# Patient Record
Sex: Male | Born: 1955 | Race: White | Hispanic: No | Marital: Married | Smoking: Never smoker
Health system: Southern US, Community
[De-identification: ages and names within clinical notes are randomized; demographics above are authoritative.]

## PROBLEM LIST (undated history)

## (undated) DIAGNOSIS — N400 Enlarged prostate without lower urinary tract symptoms: Secondary | ICD-10-CM

## (undated) DIAGNOSIS — M5126 Other intervertebral disc displacement, lumbar region: Secondary | ICD-10-CM

## (undated) DIAGNOSIS — T85398A Other mechanical complication of other ocular prosthetic devices, implants and grafts, initial encounter: Secondary | ICD-10-CM

## (undated) DIAGNOSIS — F32A Depression, unspecified: Secondary | ICD-10-CM

## (undated) DIAGNOSIS — E785 Hyperlipidemia, unspecified: Secondary | ICD-10-CM

## (undated) DIAGNOSIS — F419 Anxiety disorder, unspecified: Secondary | ICD-10-CM

## (undated) DIAGNOSIS — Z8614 Personal history of Methicillin resistant Staphylococcus aureus infection: Secondary | ICD-10-CM

## (undated) DIAGNOSIS — M1990 Unspecified osteoarthritis, unspecified site: Secondary | ICD-10-CM

## (undated) DIAGNOSIS — Z872 Personal history of diseases of the skin and subcutaneous tissue: Secondary | ICD-10-CM

## (undated) HISTORY — DX: Depression, unspecified: F32.A

## (undated) HISTORY — DX: Other mechanical complication of other ocular prosthetic devices, implants and grafts, initial encounter: T85.398A

## (undated) HISTORY — DX: Other intervertebral disc displacement, lumbar region: M51.26

## (undated) HISTORY — DX: Benign prostatic hyperplasia without lower urinary tract symptoms: N40.0

## (undated) HISTORY — DX: Hyperlipidemia, unspecified: E78.5

## (undated) HISTORY — PX: SPINE SURGERY: SHX786

## (undated) HISTORY — DX: Personal history of Methicillin resistant Staphylococcus aureus infection: Z86.14

## (undated) HISTORY — DX: Personal history of diseases of the skin and subcutaneous tissue: Z87.2

## (undated) HISTORY — DX: Unspecified osteoarthritis, unspecified site: M19.90

## (undated) HISTORY — DX: Anxiety disorder, unspecified: F41.9

---

## 1996-10-05 DIAGNOSIS — Z872 Personal history of diseases of the skin and subcutaneous tissue: Secondary | ICD-10-CM

## 1996-10-05 HISTORY — DX: Personal history of diseases of the skin and subcutaneous tissue: Z87.2

## 1999-10-06 HISTORY — PX: EYE SURGERY: SHX253

## 2002-08-04 ENCOUNTER — Ambulatory Visit
Admission: RE | Admit: 2002-08-04 | Disposition: A | Discharge status: Home / Self Care | Payer: Self-pay | Source: Ambulatory Visit

## 2002-08-14 ENCOUNTER — Ambulatory Visit
Admission: RE | Admit: 2002-08-14 | Disposition: A | Discharge status: Home / Self Care | Payer: Self-pay | Source: Ambulatory Visit

## 2002-10-05 HISTORY — PX: EYE SURGERY: SHX253

## 2006-06-04 ENCOUNTER — Ambulatory Visit: Admit: 2006-06-04 | Disposition: A | Discharge status: Home / Self Care | Payer: Self-pay | Source: Ambulatory Visit

## 2010-02-16 ENCOUNTER — Emergency Department
Admission: EM | Admit: 2010-02-16 | Disposition: A | Discharge status: Home / Self Care | Payer: Self-pay | Source: Ambulatory Visit

## 2010-02-24 ENCOUNTER — Ambulatory Visit
Admission: RE | Admit: 2010-02-24 | Disposition: A | Discharge status: Home / Self Care | Payer: Self-pay | Source: Ambulatory Visit

## 2011-05-29 ENCOUNTER — Ambulatory Visit: Payer: Self-pay | Admitting: Internal Medicine

## 2011-05-29 ENCOUNTER — Ambulatory Visit (INDEPENDENT_AMBULATORY_CARE_PROVIDER_SITE_OTHER): Admitting: Internal Medicine

## 2011-05-29 ENCOUNTER — Telehealth: Payer: Self-pay | Admitting: Internal Medicine

## 2011-05-29 ENCOUNTER — Encounter: Payer: Self-pay | Admitting: Internal Medicine

## 2011-05-29 VITALS — BP 128/78 | HR 78 | Temp 98.3°F | Resp 16 | Ht 73.0 in | Wt 238.5 lb

## 2011-05-29 DIAGNOSIS — N201 Calculus of ureter: Secondary | ICD-10-CM

## 2011-05-29 DIAGNOSIS — Z1211 Encounter for screening for malignant neoplasm of colon: Secondary | ICD-10-CM

## 2011-05-29 DIAGNOSIS — N401 Enlarged prostate with lower urinary tract symptoms: Secondary | ICD-10-CM | POA: Insufficient documentation

## 2011-05-29 DIAGNOSIS — R5383 Other fatigue: Secondary | ICD-10-CM

## 2011-05-29 DIAGNOSIS — R5381 Other malaise: Secondary | ICD-10-CM

## 2011-05-29 DIAGNOSIS — E785 Hyperlipidemia, unspecified: Secondary | ICD-10-CM

## 2011-05-29 DIAGNOSIS — R339 Retention of urine, unspecified: Secondary | ICD-10-CM

## 2011-05-29 DIAGNOSIS — R972 Elevated prostate specific antigen [PSA]: Secondary | ICD-10-CM

## 2011-05-29 DIAGNOSIS — N400 Enlarged prostate without lower urinary tract symptoms: Secondary | ICD-10-CM

## 2011-05-29 DIAGNOSIS — R413 Other amnesia: Secondary | ICD-10-CM

## 2011-05-29 DIAGNOSIS — F419 Anxiety disorder, unspecified: Secondary | ICD-10-CM

## 2011-05-29 DIAGNOSIS — H269 Unspecified cataract: Secondary | ICD-10-CM

## 2011-05-29 DIAGNOSIS — F411 Generalized anxiety disorder: Secondary | ICD-10-CM

## 2011-05-29 DIAGNOSIS — Z125 Encounter for screening for malignant neoplasm of prostate: Secondary | ICD-10-CM

## 2011-05-29 DIAGNOSIS — R338 Other retention of urine: Secondary | ICD-10-CM

## 2011-05-29 DIAGNOSIS — R519 Headache, unspecified: Secondary | ICD-10-CM | POA: Insufficient documentation

## 2011-05-29 DIAGNOSIS — R51 Headache: Secondary | ICD-10-CM

## 2011-05-29 MED ORDER — ALFUZOSIN HCL ER 10 MG PO TB24: 10.0000 mg | ORAL_TABLET | Freq: Every day | ORAL | Status: AC

## 2011-05-29 MED ORDER — CITALOPRAM HYDROBROMIDE 20 MG PO TABS: ORAL_TABLET | ORAL | Status: DC

## 2011-05-29 NOTE — Telephone Encounter (Signed)
PSA added to future orders

## 2011-05-29 NOTE — Patient Instructions (Signed)
Begin with 1/2 tablet daily of citalopram  With dinner.  After one week increase to whole tablet daily. Call if your insomnia has ot imporved in two weeks.

## 2011-05-29 NOTE — Progress Notes (Signed)
  Subjective:    Patient ID: Jonathon Laura., male    DOB: 1955-11-09, 55 y.o.   MRN: [id number]  HPI New patientr presents with memory loss,  Short term.  History of heavy alcohol and marijuana  use for 5 years which ended in 1986, remembers having touble finishing homework, procrastination.  Went to 2 yrs of community college for an AA in liberal arts before joining the [employer].   Wife does not report manic behavior but does have trouble with anger management and labile temperament with occcasional physical outbursts.  Daugher died 5 yrs ago today from [place],  Son was previously diagnosed with ADHD.  Previous trial of wellbutrin 150 mg daily 5 yrs ago for a year, not compliant.  5 previous minor accidents.    Also reports frequent headaches brought on by heat and humidity, occipital, relieved partially with NSAIDs.  Not daily.   Review of Systems  Constitutional: Negative for fever, chills, fatigue and unexpected weight change.  HENT: Negative for ear pain and congestion.   Eyes: Negative for photophobia and visual disturbance.  Respiratory: Negative for chest tightness and shortness of breath.   Cardiovascular: Negative for chest pain, palpitations and leg swelling.  Genitourinary: Positive for difficulty urinating. Negative for urgency, frequency, flank pain, discharge and scrotal swelling.  Musculoskeletal: Negative for back pain and joint swelling.  Neurological: Positive for headaches. Negative for dizziness, tremors and syncope.  Hematological: Negative for adenopathy.  Psychiatric/Behavioral: Positive for decreased concentration. Negative for confusion.       Objective:   Physical Exam  Constitutional: He is oriented to person, place, and time. He appears well-developed and well-nourished.  HENT:  Head: Normocephalic.  Eyes: EOM are normal. Pupils are equal, round, and reactive to light.  Neck: Normal range of motion. No thyromegaly present.  Cardiovascular: Normal rate,  regular rhythm and normal heart sounds.   Pulmonary/Chest: Effort normal and breath sounds normal.  Musculoskeletal: Normal range of motion.  Neurological: He is alert and oriented to person, place, and time. He has normal reflexes.  Skin: Skin is warm and dry.  Psychiatric: He has a normal mood and affect. His behavior is normal. Judgment and thought content normal.          Assessment & Plan:  1) Mood disorder:  We discussed his attention deficit , insomnia, labile moods and there are no signs or symptoms of bipolar disorder.  He appears to have generalized anxiety disorder.  Prior trial of wellbutrin for ADD was not effective.  Trial of citalopram.  Return in one month.  2) BPH: by history with prior use of uroxatral with refill requested. 3) Screening for prostate CA:  PSA ordered 4) History of hyperlipidemia:  Lipids ordered.

## 2011-05-29 NOTE — Telephone Encounter (Signed)
Patientt would like psa added to his lab order. Please advise.

## 2011-05-30 DIAGNOSIS — N400 Enlarged prostate without lower urinary tract symptoms: Secondary | ICD-10-CM | POA: Insufficient documentation

## 2011-05-30 DIAGNOSIS — F419 Anxiety disorder, unspecified: Secondary | ICD-10-CM | POA: Insufficient documentation

## 2011-06-02 ENCOUNTER — Other Ambulatory Visit (INDEPENDENT_AMBULATORY_CARE_PROVIDER_SITE_OTHER): Admitting: *Deleted

## 2011-06-02 ENCOUNTER — Encounter: Payer: Self-pay | Admitting: Gastroenterology

## 2011-06-02 DIAGNOSIS — E785 Hyperlipidemia, unspecified: Secondary | ICD-10-CM

## 2011-06-02 DIAGNOSIS — F411 Generalized anxiety disorder: Secondary | ICD-10-CM

## 2011-06-02 DIAGNOSIS — N201 Calculus of ureter: Secondary | ICD-10-CM

## 2011-06-02 DIAGNOSIS — Z125 Encounter for screening for malignant neoplasm of prostate: Secondary | ICD-10-CM

## 2011-06-02 LAB — LIPID PANEL
HDL: 41.3 mg/dL (ref >39.00)
Total CHOL/HDL Ratio: 5
Triglycerides: 81 mg/dL (ref 0.0–149.0)

## 2011-06-02 LAB — COMPREHENSIVE METABOLIC PANEL
Alkaline Phosphatase: 64 U/L (ref 39–117)
Glucose, Bld: 75 mg/dL (ref 70–99)
Sodium: 140 mEq/L (ref 135–145)
Total Bilirubin: 1.4 mg/dL — ABNORMAL HIGH (ref 0.3–1.2)
Total Protein: 7.1 g/dL (ref 6.0–8.3)

## 2011-06-02 LAB — TSH: TSH: 1.1 u[IU]/mL (ref 0.35–5.50)

## 2011-06-03 ENCOUNTER — Encounter: Payer: Self-pay | Admitting: Internal Medicine

## 2011-06-12 ENCOUNTER — Encounter

## 2011-06-22 ENCOUNTER — Encounter

## 2011-06-22 ENCOUNTER — Telehealth: Payer: Self-pay | Admitting: *Deleted

## 2011-06-22 NOTE — Telephone Encounter (Signed)
Pt. No show for previsit.  Spoke with pt and he said 07/06/11 for his colon would need to be cleared with his wife's schedule.  Pt. Agreed to call and [facility] his colon and previsit when he knew his wife's schedule.  I gave him the [phone] #.

## 2011-06-23 ENCOUNTER — Encounter: Payer: Self-pay | Admitting: Gastroenterology

## 2011-06-25 ENCOUNTER — Other Ambulatory Visit: Admitting: Gastroenterology

## 2011-06-26 ENCOUNTER — Ambulatory Visit (INDEPENDENT_AMBULATORY_CARE_PROVIDER_SITE_OTHER): Admitting: Internal Medicine

## 2011-06-26 ENCOUNTER — Encounter: Payer: Self-pay | Admitting: Internal Medicine

## 2011-06-26 VITALS — BP 128/79 | HR 85 | Temp 98.4°F | Resp 16 | Ht 73.0 in | Wt 237.5 lb

## 2011-06-26 DIAGNOSIS — E785 Hyperlipidemia, unspecified: Secondary | ICD-10-CM

## 2011-06-26 DIAGNOSIS — F3289 Other specified depressive episodes: Secondary | ICD-10-CM

## 2011-06-26 DIAGNOSIS — M549 Dorsalgia, unspecified: Secondary | ICD-10-CM

## 2011-06-26 DIAGNOSIS — F411 Generalized anxiety disorder: Secondary | ICD-10-CM

## 2011-06-26 DIAGNOSIS — F329 Major depressive disorder, single episode, unspecified: Secondary | ICD-10-CM

## 2011-06-26 DIAGNOSIS — F419 Anxiety disorder, unspecified: Secondary | ICD-10-CM

## 2011-06-26 DIAGNOSIS — G8929 Other chronic pain: Secondary | ICD-10-CM

## 2011-06-26 MED ORDER — CITALOPRAM HYDROBROMIDE 20 MG PO TABS
20.0000 mg | ORAL_TABLET | Freq: Every day | ORAL | Status: DC
Start: 2011-06-26 — End: 2011-08-20

## 2011-06-26 MED ORDER — TAMSULOSIN HCL 0.4 MG PO CAPS: 0.4000 mg | ORAL_CAPSULE | Freq: Every day | ORAL | Status: DC

## 2011-06-26 MED ORDER — HYDROCODONE-ACETAMINOPHEN 7.5-500 MG PO TABS: 1.0000 | ORAL_TABLET | Freq: Every evening | ORAL | Status: AC | PRN

## 2011-06-28 ENCOUNTER — Encounter: Payer: Self-pay | Admitting: Internal Medicine

## 2011-06-28 DIAGNOSIS — M5416 Radiculopathy, lumbar region: Secondary | ICD-10-CM | POA: Insufficient documentation

## 2011-06-28 NOTE — Assessment & Plan Note (Addendum)
improved with citalopram.  No dose changes today.

## 2011-06-28 NOTE — Assessment & Plan Note (Signed)
With history of prior lumbar surgery.  He uses naproxed during the day but his evening dose is not effective ad he is losing sleep due to tossing and turning from pain.  Trial of vicodin for nocturnal pain .

## 2011-06-28 NOTE — Progress Notes (Signed)
  Subjective:    Patient ID: Jonathon Laura., male    DOB: 1956/03/17, 55 y.o.   MRN: [id number]  HPI  55 yo white male first seen 1 month ago for with cc of anxiety disorder and BPH returns for medical followup affter initiating therapy with celexa .  He started with 10 ng fdaily for one week and then increased to 20 mg dailyis wife states that he is less irritable and appears more relaxed.  Patient reports no adverse effects and is concentrating better but not sleeping well fdue to chronic back pain that is not alleviated by Alleve.   No past medical history on file.   Current Outpatient Prescriptions on File Prior to Visit  Medication Sig Dispense Refill  . acetaminophen (TYLENOL) 325 MG tablet Take 650 mg by mouth as needed.        Marland Kitchen alfuzosin (UROXATRAL) 10 MG 24 hr tablet Take 1 tablet (10 mg total) by mouth daily.  30 tablet  5    Review of Systems  Constitutional: Negative for fever, chills, diaphoresis, activity change, appetite change, fatigue and unexpected weight change.  HENT: Negative for hearing loss, ear pain, nosebleeds, congestion, sore throat, facial swelling, rhinorrhea, sneezing, drooling, mouth sores, trouble swallowing, neck pain, neck stiffness, dental problem, voice change, postnasal drip, sinus pressure, tinnitus and ear discharge.   Eyes: Negative for photophobia, pain, discharge, redness, itching and visual disturbance.  Respiratory: Negative for apnea, cough, choking, chest tightness, shortness of breath, wheezing and stridor.   Cardiovascular: Negative for chest pain, palpitations and leg swelling.  Gastrointestinal: Negative for nausea, vomiting, abdominal pain, diarrhea, constipation, blood in stool, abdominal distention, anal bleeding and rectal pain.  Genitourinary: Negative for dysuria, urgency, frequency, hematuria, flank pain, decreased urine volume, scrotal swelling, difficulty urinating and testicular pain.  Musculoskeletal: Negative for myalgias,  back pain, joint swelling, arthralgias and gait problem.  Skin: Negative for color change, rash and wound.  Neurological: Negative for dizziness, tremors, seizures, syncope, speech difficulty, weakness, light-headedness, numbness and headaches.  Psychiatric/Behavioral: Negative for suicidal ideas, hallucinations, behavioral problems, confusion, sleep disturbance, dysphoric mood, decreased concentration and agitation. The patient is not nervous/anxious.        Objective:   Physical Exam  Constitutional: He is oriented to person, place, and time.  HENT:  Head: Normocephalic and atraumatic.  Mouth/Throat: Oropharynx is clear and moist.  Eyes: Conjunctivae and EOM are normal.  Neck: Normal range of motion. Neck supple. No JVD present. No thyromegaly present.  Cardiovascular: Normal rate, regular rhythm and normal heart sounds.   Pulmonary/Chest: Effort normal and breath sounds normal. He has no wheezes. He has no rales.  Abdominal: Soft. Bowel sounds are normal. He exhibits no mass. There is no tenderness. There is no rebound.  Musculoskeletal: Normal range of motion. He exhibits no edema.  Neurological: He is alert and oriented to person, place, and time.  Skin: Skin is warm and dry.  Psychiatric: He has a normal mood and affect.          Assessment & Plan:

## 2011-06-28 NOTE — Assessment & Plan Note (Addendum)
Mild, with LDL of 152 and HDL of 41.  Discussed diet and exercise as first therapy ,  Repeat in 6 months.

## 2011-06-29 ENCOUNTER — Telehealth: Payer: Self-pay | Admitting: *Deleted

## 2011-06-29 ENCOUNTER — Encounter

## 2011-06-29 NOTE — Telephone Encounter (Signed)
Pt. Contacted re: no show for previsit.  Wanted to [facility] his colon to a Monday and then decided the 15th of Oct. Wouldn't work either.  Then the computer froze up and he decided he would call another day and [facility] his colon when he had a better idea of his schedule.  Jonathon Hill

## 2011-07-06 ENCOUNTER — Other Ambulatory Visit: Admitting: Gastroenterology

## 2011-07-17 ENCOUNTER — Other Ambulatory Visit: Admitting: Gastroenterology

## 2011-07-20 ENCOUNTER — Other Ambulatory Visit: Admitting: Gastroenterology

## 2011-07-24 ENCOUNTER — Telehealth: Payer: Self-pay

## 2011-07-24 DIAGNOSIS — N138 Other obstructive and reflux uropathy: Secondary | ICD-10-CM

## 2011-07-24 NOTE — Telephone Encounter (Signed)
Pt's wife checking status of urology referral. Wife states that she called last week and was told that it would be done. No order placed. Please advise

## 2011-07-24 NOTE — Telephone Encounter (Signed)
Left message to notify pt's wife to call back with name of urology preference

## 2011-07-24 NOTE — Telephone Encounter (Signed)
It has not been done yet.  Does he have a preference who I send him to ?

## 2011-07-24 NOTE — Telephone Encounter (Signed)
Wife called back. He would like referral to Dr Achilles Dunk in [place], They are aware there may be a long wait to get into this MD.

## 2011-07-29 NOTE — Telephone Encounter (Signed)
I called and got patient an appt for Dec. 19,2012 with Dr. Achilles Dunk, patient is aware of appt.

## 2011-08-20 ENCOUNTER — Other Ambulatory Visit: Payer: Self-pay | Admitting: Internal Medicine

## 2011-08-20 DIAGNOSIS — F329 Major depressive disorder, single episode, unspecified: Secondary | ICD-10-CM

## 2011-08-20 DIAGNOSIS — N400 Enlarged prostate without lower urinary tract symptoms: Secondary | ICD-10-CM

## 2011-08-23 MED ORDER — CITALOPRAM HYDROBROMIDE 20 MG PO TABS: 20.0000 mg | ORAL_TABLET | Freq: Every day | ORAL | Status: DC

## 2011-08-23 MED ORDER — TAMSULOSIN HCL 0.4 MG PO CAPS: 0.4000 mg | ORAL_CAPSULE | Freq: Every day | ORAL | Status: DC

## 2011-08-23 NOTE — Telephone Encounter (Signed)
meds refill electronically

## 2011-12-25 ENCOUNTER — Ambulatory Visit: Admitting: Internal Medicine

## 2011-12-28 ENCOUNTER — Ambulatory Visit (INDEPENDENT_AMBULATORY_CARE_PROVIDER_SITE_OTHER): Admitting: Internal Medicine

## 2011-12-28 ENCOUNTER — Encounter: Payer: Self-pay | Admitting: Internal Medicine

## 2011-12-28 VITALS — BP 112/62 | HR 88 | Temp 97.9°F | Resp 14 | Ht 74.0 in | Wt 230.2 lb

## 2011-12-28 DIAGNOSIS — R339 Retention of urine, unspecified: Secondary | ICD-10-CM

## 2011-12-28 DIAGNOSIS — G8929 Other chronic pain: Secondary | ICD-10-CM

## 2011-12-28 DIAGNOSIS — F411 Generalized anxiety disorder: Secondary | ICD-10-CM

## 2011-12-28 DIAGNOSIS — M549 Dorsalgia, unspecified: Secondary | ICD-10-CM

## 2011-12-28 DIAGNOSIS — N401 Enlarged prostate with lower urinary tract symptoms: Secondary | ICD-10-CM

## 2011-12-28 DIAGNOSIS — H269 Unspecified cataract: Secondary | ICD-10-CM | POA: Insufficient documentation

## 2011-12-28 DIAGNOSIS — E785 Hyperlipidemia, unspecified: Secondary | ICD-10-CM

## 2011-12-28 DIAGNOSIS — F419 Anxiety disorder, unspecified: Secondary | ICD-10-CM

## 2011-12-28 LAB — COMPLETE METABOLIC PANEL WITH GFR
Albumin: 4.4 g/dL (ref 3.5–5.2)
BUN: 12 mg/dL (ref 6–23)
CO2: 28 mEq/L (ref 19–32)
Calcium: 9.2 mg/dL (ref 8.4–10.5)
Chloride: 104 mEq/L (ref 96–112)
GFR, Est African American: >89 mL/min
GFR, Est Non African American: >89 mL/min
Glucose, Bld: 86 mg/dL (ref 70–99)
Potassium: 4.5 mEq/L (ref 3.5–5.3)

## 2011-12-28 LAB — LIPID PANEL
Cholesterol: 191 mg/dL (ref 0–200)
HDL: 44.4 mg/dL (ref >39.00)
LDL Cholesterol: 129 mg/dL — ABNORMAL HIGH (ref 0–99)
Total CHOL/HDL Ratio: 4
Triglycerides: 87 mg/dL (ref 0.0–149.0)

## 2011-12-28 MED ORDER — ALPRAZOLAM 0.5 MG PO TABS: 0.5000 mg | ORAL_TABLET | Freq: Every day | ORAL | Status: AC | PRN

## 2011-12-28 NOTE — Patient Instructions (Addendum)
  Here are some key components of weight loss for the active adult (this is what Dr. Darrick Huntsman does)   Low glycemic index diet, eating 6 smaller meals daily  Protein  Shakes (EAS Carb Control  Or Atkins ,  Available everywhere,   In  cases at BJs )  2.5 carbs  Protein bar by Atkins (snack size,  BJ's)  At 10 am  Lunch: sandwich on pita bread (Nicholos's makes a pita bread and a flat bread , available at [employer] and [employer]J's)   Mid day :  Another protein bar,  Or cheese stick, almonds, walnuts, pistachios, pecans, peanuts,  Macadamia nuts  Lunch:  "mean and green:"  Meat, salad, and green veggie : use ranch, vinagrette,  Blue cheese, etc  Evening : Breyer's low carb fudgiscle, ice cream (Carb Smart)

## 2011-12-28 NOTE — Assessment & Plan Note (Signed)
No significant changes, no progression .  Continue prn vicodin.

## 2011-12-28 NOTE — Assessment & Plan Note (Signed)
He has been having increasing difficulty and was started on flomax by Urology recently.

## 2011-12-28 NOTE — Progress Notes (Signed)
Patient ID: Jonathon Hill., male   DOB: Aug 25, 1956, 56 y.o.   MRN: [id number]    Patient Active Problem List  Diagnoses  . Hyperlipidemia  . Benign prostatic hypertrophy with urinary retention  . Headache  . Cataract of left eye  . Anxiety disorder  . Chronic back pain greater than 3 months duration  . Anxiety  . Cataract    Subjective:  CC:   Chief Complaint  Patient presents with  . Follow-up    HPI:   Jonathon Hillis a 56 y.o. male who presents for  follow up on chronic low back pain and generalized anxiety.    His back pain has not changed.  He reports occasional radicular sxs to his foot but not on a daily basis.  He is using prn vicodin , not on a daily basis. His last surgery was in 2004, a tunnel decompression, with subsequent atrophy of muscles in left lateral foot.     Regarding his anxiety, he continues to take celexa on a daily basis.  His anxiety has increased recently due to home financial stressors. His wife's work has been cut back to 3 days/week.  Since he has retired from [employer], he feels micromanaged by her.    Regard his obesity,  He continues to work on his weight and has lost 8 lbs since September , but continues to have difficulty avoiding [employer] cookies and other confections . He is exercising regularly.      Past Medical History  Diagnosis Date  . Hyperlipidemia   . Anxiety   . Cataract     Past Surgical History  Procedure Date  . Spine surgery 1994, 2004    2 prior lumbar surgeries ([facility])  . Eye surgery 2012    cataract, detached retina 2001,  buckle of sclera  [facility]         The following portions of the patient's history were reviewed and updated as appropriate: Allergies, current medications, and problem list.    Review of Systems:   12 Pt  review of systems was negative except those addressed in the HPI,     History   Social History  . Marital Status: Married    Spouse Name: N/A    Number of  Children: N/A  . Years of Education: N/A   Occupational History  . Not on file.   Social History Main Topics  . Smoking status: Never Smoker   . Smokeless tobacco: Never Used  . Alcohol Use: Yes     rare  . Drug Use: No  . Sexually Active: Not on file   Other Topics Concern  . Not on file   Social History Narrative  . No narrative on file    Objective:  BP 112/62  Pulse 88  Temp(Src) 97.9 F (36.6 C) (Oral)  Resp 14  Ht 6\' 2"  (1.88 m)  Wt 230 lb 4 oz (104.441 kg)  BMI 29.56 kg/m2  SpO2 98%  General appearance: alert, cooperative and appears stated age Ears: normal TM's and external ear canals both ears Throat: lips, mucosa, and tongue normal; teeth and gums normal Neck: no adenopathy, no carotid bruit, supple, symmetrical, trachea midline and thyroid not enlarged, symmetric, no tenderness/mass/nodules Back: symmetric, no curvature. ROM normal. No CVA tenderness. Lungs: clear to auscultation bilaterally Heart: regular rate and rhythm, S1, S2 normal, no murmur, click, rub or gallop Abdomen: soft, non-tender; bowel sounds normal; no masses,  no organomegaly Pulses: 2+  and symmetric Skin: Skin color, texture, turgor normal. No rashes or lesions Lymph nodes: Cervical, supraclavicular, and axillary nodes normal.  Assessment and Plan:  Benign prostatic hypertrophy with urinary retention He has been having increasing difficulty and was started on flomax by Urology recently.  Anxiety disorder Aggravated by recent stressors and marital discord .  Spent 15 minutes discussing his symptoms and how he is dealing with the stress.  He would consider speaking with a counsellor/therapist if his wife would consider it.   Trial of alprazolam   Chronic back pain greater than 3 months duration No significant changes, no progression .  Continue prn vicodin.  Hyperlipidemia Improved, LDL dropped from 152 to 129 and  HDL rose from 41 to 44 with diet and exercise.     Updated  Medication List Outpatient Encounter Prescriptions as of 12/28/2011  Medication Sig Dispense Refill  . acetaminophen (TYLENOL) 325 MG tablet Take 650 mg by mouth as needed.        Marland Kitchen alfuzosin (UROXATRAL) 10 MG 24 hr tablet Take 1 tablet (10 mg total) by mouth daily.  30 tablet  5  . citalopram (CELEXA) 20 MG tablet Take 1 tablet (20 mg total) by mouth daily.  90 tablet  3  . finasteride (PROSCAR) 5 MG tablet Take 5 mg by mouth daily.      . naproxen sodium (ANAPROX) 220 MG tablet Take 220 mg by mouth 2 (two) times daily with a meal.        . Tamsulosin HCl (FLOMAX) 0.4 MG CAPS Take 1 capsule (0.4 mg total) by mouth daily.  90 capsule  3  . ALPRAZolam (XANAX) 0.5 MG tablet Take 1 tablet (0.5 mg total) by mouth daily as needed for anxiety.  20 tablet  1     Orders Placed This Encounter  Procedures  . COMPLETE METABOLIC PANEL WITH GFR  . Lipid panel    Return in about 6 months (around 06/29/2012).

## 2011-12-28 NOTE — Assessment & Plan Note (Addendum)
Improved, LDL dropped from 152 to 129 and  HDL rose from 41 to 44 with diet and exercise.

## 2011-12-28 NOTE — Assessment & Plan Note (Addendum)
Aggravated by recent stressors and marital discord .  Spent 15 minutes discussing his symptoms and how he is dealing with the stress.  He would consider speaking with a counsellor/therapist if his wife would consider it.   Trial of alprazolam

## 2012-02-05 ENCOUNTER — Other Ambulatory Visit: Payer: Self-pay | Admitting: *Deleted

## 2012-02-05 MED ORDER — HYDROCODONE-ACETAMINOPHEN 7.5-500 MG PO TABS
1.0000 | ORAL_TABLET | Freq: Four times a day (QID) | ORAL | Status: DC | PRN
Start: 2012-02-05 — End: 2012-02-09

## 2012-02-09 ENCOUNTER — Other Ambulatory Visit: Payer: Self-pay | Admitting: Internal Medicine

## 2012-02-10 MED ORDER — HYDROCODONE-ACETAMINOPHEN 7.5-500 MG PO TABS: 1.0000 | ORAL_TABLET | Freq: Four times a day (QID) | ORAL | Status: DC | PRN

## 2012-02-10 NOTE — Telephone Encounter (Signed)
This was authorized  on may 3,  Please check with pharmacy before refilling bc it should have been done .

## 2012-02-10 NOTE — Telephone Encounter (Signed)
[facility] stated they did receive the phone authorization but the medication has been expired and patient never did pick up Rx.  Rx has been called in.

## 2012-06-20 ENCOUNTER — Ambulatory Visit (INDEPENDENT_AMBULATORY_CARE_PROVIDER_SITE_OTHER): Admitting: Internal Medicine

## 2012-06-20 ENCOUNTER — Ambulatory Visit: Payer: Self-pay | Admitting: Urology

## 2012-06-20 ENCOUNTER — Encounter: Payer: Self-pay | Admitting: Internal Medicine

## 2012-06-20 VITALS — BP 98/68 | HR 72 | Resp 16 | Wt 230.5 lb

## 2012-06-20 DIAGNOSIS — F411 Generalized anxiety disorder: Secondary | ICD-10-CM

## 2012-06-20 DIAGNOSIS — M25512 Pain in left shoulder: Secondary | ICD-10-CM

## 2012-06-20 DIAGNOSIS — M25511 Pain in right shoulder: Secondary | ICD-10-CM

## 2012-06-20 DIAGNOSIS — E785 Hyperlipidemia, unspecified: Secondary | ICD-10-CM

## 2012-06-20 DIAGNOSIS — F329 Major depressive disorder, single episode, unspecified: Secondary | ICD-10-CM

## 2012-06-20 DIAGNOSIS — F419 Anxiety disorder, unspecified: Secondary | ICD-10-CM

## 2012-06-20 DIAGNOSIS — M25519 Pain in unspecified shoulder: Secondary | ICD-10-CM

## 2012-06-20 DIAGNOSIS — N400 Enlarged prostate without lower urinary tract symptoms: Secondary | ICD-10-CM

## 2012-06-20 MED ORDER — CITALOPRAM HYDROBROMIDE 20 MG PO TABS: 20.0000 mg | ORAL_TABLET | Freq: Every day | ORAL | Status: DC

## 2012-06-20 MED ORDER — TAMSULOSIN HCL 0.4 MG PO CAPS: 0.4000 mg | ORAL_CAPSULE | Freq: Every day | ORAL | Status: DC

## 2012-06-20 MED ORDER — GABAPENTIN 300 MG PO CAPS: 300.0000 mg | ORAL_CAPSULE | Freq: Three times a day (TID) | ORAL | Status: DC

## 2012-06-20 NOTE — Progress Notes (Signed)
Patient ID: Jonathon Laura., male   DOB: [birth year], 56 y.o.   MRN: [id number]  Patient Active Problem List  Diagnosis  . Hyperlipidemia  . Benign prostatic hypertrophy with urinary retention  . Headache  . Cataract of left eye  . Anxiety disorder  . Chronic back pain greater than 3 months duration  . Anxiety  . Cataract  . Bilateral shoulder pain    Subjective:  CC:   Chief Complaint  Patient presents with  . Follow-up    6 month    HPI:   Jonathon Hillis a 56 y.o. male who presents 6 month follow up generalized anxiety and arthritis with neuropathy. He has been having bilateral shoulder pain which has improved, with addition of new mattress.  The pain is greater in the left shoulder.   he is no history of prior trauma, but  use of overuse with sports and lifting .   the patient has fever and progressively worse over the years. He has had no prior orthopedic evaluation and is requesting one at this time .  Regarding his anxiety and depression this is well-controlled with citalopram which is tolerating well.. 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  the foot pain doesn't impact his ability to exercise.   Past Medical History  Diagnosis Date  . Hyperlipidemia   . Anxiety   . Cataract     Past Surgical History  Procedure Date  . Spine surgery 1994, 2004    2 prior lumbar surgeries ([facility])  . Eye surgery 2012    cataract, detached retina 2001,  buckle of sclera  [facility]         The following portions of the patient's history were reviewed and updated as appropriate: Allergies, current medications, and problem list.    Review of Systems:   12 Pt  review of systems was negative except those addressed in the HPI,     History   Social History  . Marital Status: Married    Spouse Name: N/A    Number of Children: N/A  . Years  of Education: N/A   Occupational History  . Not on file.   Social History Main Topics  . Smoking status: Never Smoker   . Smokeless tobacco: Never Used  . Alcohol Use: Yes     rare  . Drug Use: No  . Sexually Active: Not on file   Other Topics Concern  . Not on file   Social History Narrative  . No narrative on file    Objective:  BP 98/68  Pulse 72  Resp 16  Wt 230 lb 8 oz (104.554 kg)  SpO2 97%  General appearance: alert, cooperative and appears stated age Ears: normal TM's and external ear canals both ears Throat: lips, mucosa, and tongue normal; teeth and gums normal Neck: no adenopathy, no carotid bruit, supple, symmetrical, trachea midline and thyroid not enlarged, symmetric, no tenderness/mass/nodules Back: symmetric, no curvature. ROM normal. No CVA tenderness. Lungs: clear to auscultation bilaterally Heart: regular rate and rhythm, S1, S2 normal, no murmur, click, rub or gallop Abdomen: soft, non-tender; bowel sounds normal; no masses,  no organomegaly Pulses: 2+ and symmetric Skin: Skin color, texture, turgor normal. No rashes or lesions MSK: left shoulder with painful internal and abduction, strength R>L.  Lymph nodes: Cervical, supraclavicular, and axillary nodes normal. DTRS":  1+ bilateral patellars  Assessment and Plan:  Bilateral shoulder pain Exam and history suggestive of rotator cuff syndrome bilaterally. Left greater than right due to prior sports activities. Referral to resume her orthopedist underway.  Anxiety disorder Well controlled on citalopram.  Hyperlipidemia Improved with reduction in LDL at 30 points attributed to exercise weight loss. He is due for repeat lipids.   Updated Medication List Outpatient Encounter Prescriptions as of 06/20/2012  Medication Sig Dispense Refill  . acetaminophen (TYLENOL) 325 MG tablet Take 650 mg by mouth as needed.        . citalopram (CELEXA) 20 MG tablet Take 1 tablet (20 mg total) by mouth daily.  90  tablet  3  . naproxen sodium (ANAPROX) 220 MG tablet Take 220 mg by mouth 2 (two) times daily with a meal.        . Tamsulosin HCl (FLOMAX) 0.4 MG CAPS Take 1 capsule (0.4 mg total) by mouth daily.  90 capsule  3  . DISCONTD: citalopram (CELEXA) 20 MG tablet Take 1 tablet (20 mg total) by mouth daily.  90 tablet  3  . DISCONTD: Tamsulosin HCl (FLOMAX) 0.4 MG CAPS Take 1 capsule (0.4 mg total) by mouth daily.  90 capsule  3  . gabapentin (NEURONTIN) 300 MG capsule Take 1 capsule (300 mg total) by mouth 3 (three) times daily. As needed for neuropathic pain  90 capsule  3  . DISCONTD: finasteride (PROSCAR) 5 MG tablet Take 5 mg by mouth daily.         Orders Placed This Encounter  Procedures  . Lipid panel  . Comprehensive metabolic panel  . Ambulatory referral to Sports Medicine    No Follow-up on file.

## 2012-06-20 NOTE — Assessment & Plan Note (Signed)
Exam and history suggestive of rotator cuff syndrome bilaterally. Left greater than right due to prior sports activities. Referral to resume her orthopedist underway.

## 2012-06-20 NOTE — Assessment & Plan Note (Signed)
Well controlled on citalopram.  

## 2012-06-20 NOTE — Assessment & Plan Note (Signed)
Improved with reduction in LDL at 30 points attributed to exercise weight loss. He is due for repeat lipids.   

## 2012-06-20 NOTE — Patient Instructions (Addendum)
Return for fasting lipids at your convenience   You should use the naproxen daily for two weeks ,along with OTC Prilosec or Prevacid  To protect your stomach,  To treat back flares.  The gabapentin is to treat the pain from a pinched nerve in your back that is causing your calf and foot pain .  Alternate ice and heat,  15 minutes per session.  You are overdue for your colonoscopy; We will be happy to help you schedule it with [facility] if you have any trouble    This is  Dr. Norton Blizzard version of a  "Low GI"  Diet:  All of the foods can be found at grocery stores and in bulk at [employer].  The Atkins protein bars and shakes are available in more varieties at Target, WalMart and [employer].     7 AM Breakfast:  Low carbohydrate Protein  Shakes (I recommend the EAS AdvantEdge "Carb Control" shakes  Or the low carb shakes by Atkins.   Both are available everywhere:  In  cases at BJs  Or in 4 packs at grocery stores and pharmacies  2.5 carbs  (Alternative is  a toasted Arnold's Sandwhich Thin w/ peanut butter, a "Bagel Thin" with cream cheese and salmon) or  a scrambled egg burrito made with a low carb tortilla .  Avoid cereal and bananas, oatmeal too unless you are cooking the old fashioned kind that takes 30-40 minutes to prepare.  the rest is overly processed, has minimal fiber, and is loaded with carbohydrates!   10 AM: Protein bar by Atkins (the snack size, under 200 cal).  There are many varieties , available widely again or in bulk in limited varieties at BJs)  Other so called "protein bars" tend to be loaded with carbohydrates.  Remember, in food advertising, the word "energy" is synonymous for " carbohydrate."  Lunch: sandwich of [place], (or any lunchmeat, grilled meat or canned tuna), fresh avocado, mayonnaise  and cheese on a lower carbohydrate pita bread, flatbread, or tortilla . Ok to use regular mayonnaise. The bread is the only source or carbohydrate that can be decreased (Jerryl's makes  a pita bread and a flat bread that are 50 cal and 4 net carbs ; Toufayan makes a low carb flatbread that's 100 cal and 9 net carbs  and  [place] makes a low carb whole wheat tortilla  That is 210 cal and 6 net carbs)  3 PM:  Mid day :  Another protein bar,  Or a  cheese stick (100 cal, 0 carbs),  Or 1 ounce of  almonds, walnuts, pistachios, pecans, peanuts,  Macadamia nuts. Or a Dannon light n Fit greek yogurt, 80 cal 8 net carbs . Avoid "granola"; the dried cranberries and raisins are loaded with carbohydrates. Mixed nuts ok if no raisins or cranberries or dried fruit.      6 PM  Dinner:  "mean and green:"  Meat/chicken/fish or a high protein legume; , with a green salad, and a low GI  Veggie (broccoli, cauliflower, green beans, spinach, brussel sprouts. Lima beans) : Avoid "Low fat dressings, as well as Reyne Dumas and [address]! They are loaded with sugar! Instead use ranch, vinagrette,  Blue cheese, etc  9 PM snack : Breyer's "low carb" fudgsicle or  ice cream bar (Carb Smart line), or  Weight Watcher's ice cream bar , or another "no sugar added" ice cream;a serving of fresh berries/cherries with whipped cream (Avoid bananas, pineapple, grapes  and  watermelon on a regular basis because they are high in sugar)   Remember that snack Substitutions should be less than 15 to 20 carbs  Per serving. Remember to subtract fiber grams and sugar alcohols to get the "net carbs."

## 2012-06-27 ENCOUNTER — Institutional Professional Consult (permissible substitution): Admitting: Sports Medicine

## 2012-07-04 ENCOUNTER — Ambulatory Visit: Admitting: Internal Medicine

## 2012-07-06 ENCOUNTER — Encounter: Payer: Self-pay | Admitting: Gastroenterology

## 2012-07-11 ENCOUNTER — Encounter: Payer: Self-pay | Admitting: *Deleted

## 2012-07-11 ENCOUNTER — Ambulatory Visit (INDEPENDENT_AMBULATORY_CARE_PROVIDER_SITE_OTHER)

## 2012-07-11 ENCOUNTER — Encounter: Payer: Self-pay | Admitting: Sports Medicine

## 2012-07-11 ENCOUNTER — Ambulatory Visit (INDEPENDENT_AMBULATORY_CARE_PROVIDER_SITE_OTHER): Admitting: Sports Medicine

## 2012-07-11 VITALS — BP 123/67 | HR 75 | Ht 73.0 in | Wt 235.0 lb

## 2012-07-11 DIAGNOSIS — M25519 Pain in unspecified shoulder: Secondary | ICD-10-CM

## 2012-07-11 DIAGNOSIS — M25511 Pain in right shoulder: Secondary | ICD-10-CM

## 2012-07-11 MED ORDER — MELOXICAM 15 MG PO TABS: ORAL_TABLET | ORAL | Status: DC

## 2012-07-11 NOTE — Assessment & Plan Note (Addendum)
Left worse than right today. Ultrasound guided injection as above. X-ray shoulder. He will do one visit the physical therapist, and then daily home rehabilitation. Mobic. I will see him back in 4 weeks to reassess.

## 2012-07-11 NOTE — Progress Notes (Signed)
Subjective:    I'm seeing this patient as a consultation for:  Dr. Darrick Huntsman  CC: Left shoulder pain  HPI: Jonathon Hill is a very pleasant 15 male with bilateral shoulder pain, left worse than right. He's noticed this for years now. The pain is localized over the deltoid, is worse with overhead activities, wakes him up from sleep, and does not radiate past the elbow. He's taken occasional over-the-counter NSAID, has never had any formal evaluation for this pain. He denies any trauma. He does play golf twice a week, and tends to lift heavy objects at work.  Past medical history, Surgical history, Family history, Social history, Allergies, and medications have been entered into the medical record, reviewed, and no changes needed.   Review of Systems: No headache, visual changes, nausea, vomiting, diarrhea, constipation, dizziness, abdominal pain, skin rash, fevers, chills, night sweats, weight loss, swollen lymph nodes, body aches, joint swelling, muscle aches, chest pain, or shortness of breath.   Objective:   Vitals:  Afebrile, vital signs stable. General: Well Developed, well nourished, and in no acute distress.  Neuro/Psych: Alert and oriented x3, extra-ocular muscles intact, able to move all 4 extremities.  Skin: Warm and dry, no rashes noted.  Respiratory: Not using accessory muscles, speaking in full sentences, trachea midline.  Cardiovascular: Pulses palpable, no extremity edema. Abdomen: Does not appear distended. Left Shoulder: Inspection reveals no abnormalities, atrophy or asymmetry. There is minimal to palpation anteriorly. ROM is full in all planes. Rotator cuff strength normal throughout. Positive Neer's, Hawkins, and Empty Can tests. Strength is excellent. Speeds and Yergason's tests normal. No labral pathology noted with negative Obrien's, negative clunk and good stability. Normal scapular function observed. No painful arc and no drop arm sign. No apprehension sign  Procedure:  Real-time Ultrasound Guided Injection of left subacromial bursa Device: GE Logiq E  Ultrasound guided injection is preferred based studies that show increased duration, increased effect, greater accuracy, decreased procedural pain, increased response rate, and decreased cost with ultrasound guided versus blind injection.  Verbal informed consent obtained.  Time-out conducted.  Noted no overlying erythema, induration, or other signs of local infection.  Skin prepped in a sterile fashion.  Local anesthesia: Topical Ethyl chloride.  With sterile technique and under real time ultrasound guidance:  Subscapularis, supraspinatus, infraspinatus, teres minor, biceps tendon were all imaged, and there is no sign of tearing. He did have a distended subacromial bursa. Needle was advanced into this bursa, 1 cc Kenalog 40, 4 cc lidocaine injected easily, bursa seen distending under ultrasound guidance. Completed without difficulty  Pain immediately resolved suggesting accurate placement of the medication.  Advised to call if fevers/chills, erythema, induration, drainage, or persistent bleeding.  Images permanently stored and available for review in the ultrasound unit.  Impression: Technically successful ultrasound guided injection.   Impression and Recommendations:   This case required medical decision making of moderate complexity.

## 2012-07-25 ENCOUNTER — Ambulatory Visit: Attending: Sports Medicine

## 2012-07-25 DIAGNOSIS — IMO0001 Reserved for inherently not codable concepts without codable children: Secondary | ICD-10-CM | POA: Insufficient documentation

## 2012-07-25 DIAGNOSIS — M25519 Pain in unspecified shoulder: Secondary | ICD-10-CM | POA: Insufficient documentation

## 2012-08-08 ENCOUNTER — Encounter: Payer: Self-pay | Admitting: Sports Medicine

## 2012-08-08 ENCOUNTER — Ambulatory Visit (INDEPENDENT_AMBULATORY_CARE_PROVIDER_SITE_OTHER): Admitting: Sports Medicine

## 2012-08-08 ENCOUNTER — Ambulatory Visit (INDEPENDENT_AMBULATORY_CARE_PROVIDER_SITE_OTHER)

## 2012-08-08 VITALS — BP 128/79 | HR 82 | Wt 234.0 lb

## 2012-08-08 DIAGNOSIS — M25519 Pain in unspecified shoulder: Secondary | ICD-10-CM

## 2012-08-08 DIAGNOSIS — M545 Low back pain: Secondary | ICD-10-CM

## 2012-08-08 DIAGNOSIS — G8929 Other chronic pain: Secondary | ICD-10-CM

## 2012-08-08 DIAGNOSIS — IMO0002 Reserved for concepts with insufficient information to code with codable children: Secondary | ICD-10-CM

## 2012-08-08 DIAGNOSIS — M549 Dorsalgia, unspecified: Secondary | ICD-10-CM

## 2012-08-08 DIAGNOSIS — M25511 Pain in right shoulder: Secondary | ICD-10-CM

## 2012-08-08 MED ORDER — GABAPENTIN 300 MG PO CAPS: ORAL_CAPSULE | ORAL | Status: DC

## 2012-08-08 NOTE — Assessment & Plan Note (Signed)
Only on 300 mg gabapentin each bedtime. I will refill this, with an up taper. Rehabilitation exercises. X-rays He will come back to see me in 4 weeks, and if no better I would certainly like to repeat the MRI for interventional injection plan.

## 2012-08-08 NOTE — Progress Notes (Signed)
SPORTS MEDICINE CONSULTATION REPORT   Subjective:    CC: Followup shoulder pain  HPI: Bilateral shoulder pain: Left was far worse than the right with signs and symptoms suggestive of rotator cuff. He has been intermittent with his rotator cuff exercises, I did perform an ultrasound-guided subacromial bursa injection at the last visit. Overall he 60-70% better, is no longer woken up from sleep, is trying to avoid overhead activities as much as he can. 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    Degenerative disc disease: It sounds as though it had an L4-L5, and L5-S1 microdiscectomy for radicular symptoms in the past. Unfortunately, the entire workup, and treatment was delayed at the [place]. He is in it up with persistent weakness, with radicular symptoms down the left leg, the anterior left lower leg, with weakness to eversion and plantar flexion.  He is currently on gabapentin 300 mg at bedtime, but hasn't noticed any improvement in his symptoms. He does get cramping in his left leg and foot through the night. He will not wear an ankle foot orthosis at night.  Past medical history, Surgical history, Family history, Social history, Allergies, and medications have been entered into the medical record, reviewed, and no changes needed.   Review of Systems: No headache, visual changes, nausea, vomiting, diarrhea, constipation, dizziness, abdominal pain, skin rash, fevers, chills, night sweats, weight loss, swollen lymph nodes, body aches, joint swelling, muscle aches, chest pain, or shortness of breath.   Objective:   Vitals:  Afebrile, vital signs stable. General: Well Developed, well nourished, and in no acute distress.  Neuro/Psych: Alert and oriented x3, extra-ocular muscles intact, able to move all 4 extremities.  Skin: Warm and dry, no rashes noted.  Respiratory: Not using accessory muscles, speaking in full sentences, trachea midline.  Cardiovascular:  Pulses palpable, no extremity edema. Abdomen: Does not appear distended. Back Exam:  Inspection: Unremarkable  Motion: Flexion 45 deg, Extension 45 deg, Side Bending to 45 deg bilaterally, Weak to dorsiflexion and eversion, 4-/5. Rotation to 45 deg bilaterally. SLR laying: Negative  XSLR laying: Negative  Palpable tenderness: None. FABER: negative. Sensory change: Gross sensation intact to all lumbar and sacral dermatomes.  Reflexes: 2+ at both patellar tendons, 2+ at achilles tendons, Babinski's downgoing.  Strength at foot  Plantar-flexion: 5/5 Dorsi-flexion: 5/5 Eversion: 5/5 Inversion: 5/5  Leg strength  Quad: 5/5 Hamstring: 5/5 Hip flexor: 5/5 Hip abductors: 5/5  Gait unremarkable  Left Shoulder: Inspection reveals no abnormalities, atrophy or asymmetry. Palpation is normal with no tenderness over AC joint or bicipital groove. ROM is full in all planes. Rotator cuff strength normal throughout. No signs of impingement with negative Neer and Hawkin's tests, empty can sign. Speeds and Yergason's tests normal. No labral pathology noted with negative Obrien's, negative clunk and good stability. Normal scapular function observed. No painful arc and no drop arm sign. No apprehension sign.  Impression and Recommendations:   This case required medical decision making of moderate complexity.

## 2012-08-08 NOTE — Assessment & Plan Note (Signed)
Symptoms are 70% improved status post ultrasound-guided left subacromial injection. He's also been intermittently doing his rehabilitation. We agreed that he would be more aggressive with his home rehabilitation, daily. He can come back to see me on an as-needed basis for this, I can offer him a single additional ultrasound-guided injection before MRI, for surgical planning.

## 2012-08-08 NOTE — Patient Instructions (Addendum)
Spine surgeon would be Dr. Estill Bamberg at [facility], if surgery becomes an option.

## 2012-08-22 ENCOUNTER — Ambulatory Visit (INDEPENDENT_AMBULATORY_CARE_PROVIDER_SITE_OTHER): Admitting: Sports Medicine

## 2012-08-22 ENCOUNTER — Telehealth: Payer: Self-pay | Admitting: *Deleted

## 2012-08-22 ENCOUNTER — Ambulatory Visit (AMBULATORY_SURGERY_CENTER)

## 2012-08-22 VITALS — Ht 74.0 in | Wt 227.0 lb

## 2012-08-22 DIAGNOSIS — G8929 Other chronic pain: Secondary | ICD-10-CM

## 2012-08-22 DIAGNOSIS — Z1211 Encounter for screening for malignant neoplasm of colon: Secondary | ICD-10-CM

## 2012-08-22 DIAGNOSIS — M549 Dorsalgia, unspecified: Secondary | ICD-10-CM

## 2012-08-22 MED ORDER — PREDNISONE 50 MG PO TABS: ORAL_TABLET | ORAL | Status: DC

## 2012-08-22 MED ORDER — NA SULFATE-K SULFATE-MG SULF 17.5-3.13-1.6 GM/177ML PO SOLN: 1.0000 | Freq: Once | ORAL | Status: DC

## 2012-08-22 NOTE — Telephone Encounter (Signed)
Called [facility] and cancelled Prednisone RX that was sent. Dannielle Huh states they have put a hold on the account and that pt has to call to cancel the hold in 24-48 hrs. Wife informed.

## 2012-08-22 NOTE — Telephone Encounter (Signed)
Message copied by Renea Ee on Mon Aug 22, 2012 12:34 PM ------      Message from: Monica Becton      Created: Mon Aug 22, 2012 12:24 PM       [facility] Misty, I accidentally called in prednisone to [facility]. I have sent it in to his local pharmacy, please call [facility] to cancel his prednisone.      -T

## 2012-08-22 NOTE — Progress Notes (Signed)
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 He was currently doing the gabapentin up taper, but unfortunately has noted increased weakness, and pain that runs down the posterior aspect of his right calf, and the plantar aspect of his right foot. He has weakness with plantar flexion, and a little bit with dorsiflexion.  He denies any bowel or bladder incontinence, or numbness in the saddle region. Symptoms are moderate.  Past medical history, Surgical history, Family history, Social history, Allergies, and medications have been entered into the medical record, reviewed, and no changes needed.   Review of Systems: No headache, visual changes, nausea, vomiting, diarrhea, constipation, dizziness, abdominal pain, skin rash, fevers, chills, night sweats, weight loss, swollen lymph nodes, body aches, joint swelling, muscle aches, chest pain, or shortness of breath.   Objective:   Vitals:  Afebrile, vital signs stable. General: Well Developed, well nourished, and in no acute distress.  Neuro/Psych: Alert and oriented x3, extra-ocular muscles intact, able to move all 4 extremities.  Skin: Warm and dry, no rashes noted.  Respiratory: Not using accessory muscles, speaking in full sentences, trachea midline.  Cardiovascular: Pulses palpable, no extremity edema. Abdomen: Does not appear distended. Back Exam:  Inspection: Unremarkable  Motion: Flexion 45 deg, Extension 45 deg, Side Bending to 45 deg bilaterally, Weak to dorsiflexion and eversion, 4-/5. Rotation to 45 deg bilaterally. SLR laying: Negative  XSLR laying: Negative  Palpable tenderness: None. FABER: negative. Sensory change: Gross sensation intact to all lumbar and sacral dermatomes.  Reflexes: 2+ at both patellar tendons, 2+  at achilles tendons, Babinski's downgoing.  Strength at foot  Plantar-flexion: 5/5 Dorsi-flexion: 5/5 Eversion: 5/5 Inversion: 5/5  Leg strength  Quad: 5/5 Hamstring: 5/5 Hip flexor: 5/5 Hip abductors: 5/5  Gait unremarkable. He does have some difficulty with heel walking with difficulty dorsiflexing his right foot.  Impression and Recommendations:   This case required medical decision making of moderate complexity.

## 2012-08-22 NOTE — Assessment & Plan Note (Signed)
Jonathon Hill is unfortunately developing some new onset weakness in his right calf muscle along with some numbness and tingling in an S1 distribution. 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 Again, he is status post L4-L5, and L5-S1 microdiscectomy. We will burst of prednisone, and obtain an MRI of his lumbar spine. He will come back to see me to go over results of this and come up with a plan. I would also like him to continue his gabapentin taper.

## 2012-08-27 ENCOUNTER — Ambulatory Visit (HOSPITAL_BASED_OUTPATIENT_CLINIC_OR_DEPARTMENT_OTHER)
Admission: RE | Admit: 2012-08-27 | Discharge: 2012-08-27 | Disposition: A | Discharge status: Home / Self Care | Source: Ambulatory Visit | Attending: Sports Medicine | Admitting: Sports Medicine

## 2012-08-27 DIAGNOSIS — M79609 Pain in unspecified limb: Secondary | ICD-10-CM | POA: Insufficient documentation

## 2012-08-27 DIAGNOSIS — G8929 Other chronic pain: Secondary | ICD-10-CM | POA: Insufficient documentation

## 2012-08-27 DIAGNOSIS — M545 Low back pain, unspecified: Secondary | ICD-10-CM | POA: Insufficient documentation

## 2012-08-27 DIAGNOSIS — IMO0001 Reserved for inherently not codable concepts without codable children: Secondary | ICD-10-CM | POA: Insufficient documentation

## 2012-08-27 DIAGNOSIS — M549 Dorsalgia, unspecified: Secondary | ICD-10-CM

## 2012-08-27 MED ORDER — GADOBENATE DIMEGLUMINE 529 MG/ML IV SOLN
20.0000 mL | Freq: Once | INTRAVENOUS | Status: AC | PRN
  Administered 2012-08-27: 20 mL via INTRAVENOUS

## 2012-09-05 ENCOUNTER — Ambulatory Visit (INDEPENDENT_AMBULATORY_CARE_PROVIDER_SITE_OTHER): Admitting: Sports Medicine

## 2012-09-05 ENCOUNTER — Ambulatory Visit (AMBULATORY_SURGERY_CENTER): Admitting: Gastroenterology

## 2012-09-05 ENCOUNTER — Encounter: Payer: Self-pay | Admitting: Gastroenterology

## 2012-09-05 ENCOUNTER — Encounter: Payer: Self-pay | Admitting: Sports Medicine

## 2012-09-05 VITALS — BP 119/83 | HR 69 | Temp 98.7°F | Resp 30 | Ht 74.0 in | Wt 227.0 lb

## 2012-09-05 VITALS — BP 127/87 | HR 84 | Wt 229.0 lb

## 2012-09-05 DIAGNOSIS — Z1211 Encounter for screening for malignant neoplasm of colon: Secondary | ICD-10-CM

## 2012-09-05 DIAGNOSIS — M549 Dorsalgia, unspecified: Secondary | ICD-10-CM

## 2012-09-05 DIAGNOSIS — D126 Benign neoplasm of colon, unspecified: Secondary | ICD-10-CM

## 2012-09-05 DIAGNOSIS — K5730 Diverticulosis of large intestine without perforation or abscess without bleeding: Secondary | ICD-10-CM

## 2012-09-05 DIAGNOSIS — G8929 Other chronic pain: Secondary | ICD-10-CM

## 2012-09-05 MED ORDER — SODIUM CHLORIDE 0.9 % IV SOLN: 500.0000 mL | INTRAVENOUS | Status: DC

## 2012-09-05 NOTE — Op Note (Signed)
[facility] [address] [place], [address]   COLONOSCOPY PROCEDURE REPORT  PATIENT: Jonathon Hill, Jonathon Hill  MR#: [id number] BIRTHDATE: May 06, 1956 , 56  yrs. old GENDER: Male ENDOSCOPIST: Louis Meckel, MD REFERRED [id number] Darrick Huntsman, M.D. PROCEDURE DATE:  09/05/2012 PROCEDURE:   Colonoscopy with snare polypectomy ASA CLASS:   Class I INDICATIONS:average risk screening. MEDICATIONS: MAC sedation, administered by CRNA and propofol (Diprivan) 200mg  IV  DESCRIPTION OF PROCEDURE:   After the risks benefits and alternatives of the procedure were thoroughly explained, informed consent was obtained.  A digital rectal exam revealed no abnormalities of the rectum.   The LB CF-[id number]  endoscope was introduced through the anus and advanced to the cecum, which was identified by both the appendix and ileocecal valve. No adverse events experienced.   The quality of the prep was Suprep excellent The instrument was then slowly withdrawn as the colon was fully examined.      COLON FINDINGS: A sessile polyp measuring 3 mm in size was found in the descending colon.  A polypectomy was performed with a cold snare.  The resection was complete and the polyp tissue was completely retrieved.   A sessile polyp measuring 2 mm in size was found in the rectum.  A polypectomy was performed with a cold snare.  The resection was complete and the polyp tissue was completely retrieved.   Mild diverticulosis was noted in the sigmoid colon.   The colon mucosa was otherwise normal. Retroflexed views revealed no abnormalities. The time to cecum=3 minutes 08 seconds.  Withdrawal time=8 minutes 48 seconds.  The scope was withdrawn and the procedure completed. COMPLICATIONS: There were no complications.  ENDOSCOPIC IMPRESSION: 1.   Sessile polyp measuring 3 mm in size was found in the descending colon; polypectomy was performed with a cold snare 2.   Sessile polyp measuring 2 mm in  size was found in the rectum; polypectomy was performed with a cold snare 3.   Mild diverticulosis was noted in the sigmoid colon 4.   The colon mucosa was otherwise normal  RECOMMENDATIONS: If the polyp(s) removed today are proven to be adenomatous (pre-cancerous) polyps, you will need a repeat colonoscopy in 5 years.  Otherwise you should continue to follow colorectal cancer screening guidelines for "routine risk" patients with colonoscopy in 10 years.  You will receive a letter within 1-2 weeks with the results of your biopsy as well as final recommendations.  Please call my office if you have not received a letter after 3 weeks.   eSigned:  Louis Meckel, MD 09/05/2012 9:21 AM   cc:   PATIENT NAME:  [name] MR#: [id number]

## 2012-09-05 NOTE — Patient Instructions (Signed)
YOU HAD AN ENDOSCOPIC PROCEDURE TODAY AT THE [facility]: Refer to the procedure report that was given to you for any specific questions about what was found during the examination.  If the procedure report does not answer your questions, please call your gastroenterologist to clarify.  If you requested that your care partner not be given the details of your procedure findings, then the procedure report has been included in a sealed envelope for you to review at your convenience later.  YOU SHOULD EXPECT: Some feelings of bloating in the abdomen. Passage of more gas than usual.  Walking can help get rid of the air that was put into your GI tract during the procedure and reduce the bloating. If you had a lower endoscopy (such as a colonoscopy or flexible sigmoidoscopy) you may notice spotting of blood in your stool or on the toilet paper. If you underwent a bowel prep for your procedure, then you may not have a normal bowel movement for a few days.  DIET: Your first meal following the procedure should be a light meal and then it is ok to progress to your normal diet.  A half-sandwich or bowl of soup is an example of a good first meal.  Heavy or fried foods are harder to digest and may make you feel nauseous or bloated.  Likewise meals heavy in dairy and vegetables can cause extra gas to form and this can also increase the bloating.  Drink plenty of fluids but you should avoid alcoholic beverages for 24 hours.  ACTIVITY: Your care partner should take you home directly after the procedure.  You should plan to take it easy, moving slowly for the rest of the day.  You can resume normal activity the day after the procedure however you should NOT DRIVE or use heavy machinery for 24 hours (because of the sedation medicines used during the test).    SYMPTOMS TO REPORT IMMEDIATELY: A gastroenterologist can be reached at any hour.  During normal business hours, 8:30 AM to 5:00 PM Monday through Friday,  call [phone].  After hours and on weekends, please call the GI answering service at [phone] who will take a message and have the physician on call contact you.   Following lower endoscopy (colonoscopy or flexible sigmoidoscopy):  Excessive amounts of blood in the stool  Significant tenderness or worsening of abdominal pains  Swelling of the abdomen that is new, acute  Fever of 100F or higher    FOLLOW UP: If any biopsies were taken you will be contacted by phone or by letter within the next 1-3 weeks.  Call your gastroenterologist if you have not heard about the biopsies in 3 weeks.  Our staff will call the home number listed on your records the next business day following your procedure to check on you and address any questions or concerns that you may have at that time regarding the information given to you following your procedure. This is a courtesy call and so if there is no answer at the home number and we have not heard from you through the emergency physician on call, we will assume that you have returned to your regular daily activities without incident.  SIGNATURES/CONFIDENTIALITY: You and/or your care partner have signed paperwork which will be entered into your electronic medical record.  These signatures attest to the fact that that the information above on your After Visit Summary has been reviewed and is understood.  Full responsibility of the confidentiality   of this discharge information lies with you and/or your care-partner.  Information on polyps given to you today  Information on diverticulosis & high fiber diet given to you today 

## 2012-09-05 NOTE — Progress Notes (Signed)
SPORTS MEDICINE CONSULTATION REPORT  Subjective:    CC: Follow up  HPI: Jonathon Hill comes back to see me regarding right-sided S1 lumbar radiculitis. He recently had an MRI with intravenous contrast. To recap, he developed numbness, tingling, and a burning sensation traveling from his back, down his right buttock, right posterior thigh, right posterior calf, and into the foot. He did have some weakness in his calf afterwards. 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 We recently obtained an MRI of his lumbar spine. He denies any bowel or bladder symptoms, the pain is moderate.  Past medical history, Surgical history, Family history, Social history, Allergies, and medications have been entered into the medical record, reviewed, and no changes needed.   Review of Systems: No headache, visual changes, nausea, vomiting, diarrhea, constipation, dizziness, abdominal pain, skin rash, fevers, chills, night sweats, weight loss, swollen lymph nodes, body aches, joint swelling, muscle aches, chest pain, shortness of breath, mood changes, visual or auditory hallucinations.   Objective:   Vitals:  Afebrile, vital signs stable. General: Well Developed, well nourished, and in no acute distress.  Neuro/Psych: Alert and oriented x3, extra-ocular muscles intact, able to move all 4 extremities.  Skin: Warm and dry, no rashes noted.  Respiratory: Not using accessory muscles, speaking in full sentences, trachea midline.  Cardiovascular: Pulses palpable, no extremity edema. Abdomen: Does not appear distended. Back Exam:  Inspection: Unremarkable  Motion: Flexion 45 deg, Extension 45 deg, Side Bending to 45 deg bilaterally, Weak to dorsiflexion and eversion, 4-/5. Rotation to 45 deg bilaterally. SLR laying: Negative  XSLR laying: Negative  Palpable tenderness: None. FABER: negative. Sensory change: Gross  sensation intact to all lumbar and sacral dermatomes.  Reflexes: 2+ at both patellar tendons, 2+ at achilles tendons, Babinski's downgoing.  Strength at foot  Plantar-flexion: 5/5 Dorsi-flexion: 5/5 Eversion: 5/5 Inversion: 5/5  Leg strength  Quad: 5/5 Hamstring: 5/5 Hip flexor: 5/5 Hip abductors: 5/5  Gait unremarkable. He does have some difficulty with heel walking with difficulty dorsiflexing his right foot.  I did review his MRI personally, it shows disc protrusions, at the L4-5 and L5-S1 levels. 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 Left worse than right.  Impression and Recommendations:   This case required medical decision making of moderate complexity.  I spent over 40 minutes with this patient, greater than 50% was face-to-face counseling.

## 2012-09-05 NOTE — Progress Notes (Signed)
Patient did not have preoperative order for IV antibiotic SSI prophylaxis. (G8918)  Patient did not experience any of the following events: a burn prior to discharge; a fall within the facility; wrong site/side/patient/procedure/implant event; or a hospital transfer or hospital admission upon discharge from the facility. (G8907)  

## 2012-09-05 NOTE — Assessment & Plan Note (Signed)
Symptoms are fairly classic for right-sided S1 radiculitis. I think that we do need to proceed with transforaminal injection at the L5-S1 level. I would also like him to see Dr. Estill Bamberg at [facility] to be established. I would like to see him back after his first injection to see how well he did. We will set this up through [facility] imaging.

## 2012-09-06 ENCOUNTER — Telehealth: Payer: Self-pay | Admitting: *Deleted

## 2012-09-06 NOTE — Telephone Encounter (Signed)
  Follow up Call-  Call back number 09/05/2012  Post procedure Call Back phone  # [phone]  Permission to leave phone message Yes     Patient questions:  Do you have a fever, pain , or abdominal swelling? no Pain Score  0 *  Have you tolerated food without any problems? yes  Have you been able to return to your normal activities? yes  Do you have any questions about your discharge instructions: Diet   no Medications  no Follow up visit  no  Do you have questions or concerns about your Care? no  Actions: * If pain score is 4 or above: No action needed, pain <4.

## 2012-09-09 ENCOUNTER — Encounter: Payer: Self-pay | Admitting: Gastroenterology

## 2012-09-19 ENCOUNTER — Ambulatory Visit: Admitting: Sports Medicine

## 2012-11-19 ENCOUNTER — Other Ambulatory Visit: Payer: Self-pay

## 2012-12-19 ENCOUNTER — Ambulatory Visit (INDEPENDENT_AMBULATORY_CARE_PROVIDER_SITE_OTHER): Admitting: Internal Medicine

## 2012-12-19 ENCOUNTER — Other Ambulatory Visit: Payer: Self-pay | Admitting: General Practice

## 2012-12-19 ENCOUNTER — Encounter: Payer: Self-pay | Admitting: Internal Medicine

## 2012-12-19 VITALS — BP 120/80 | HR 69 | Temp 98.4°F | Resp 16 | Wt 228.5 lb

## 2012-12-19 DIAGNOSIS — N401 Enlarged prostate with lower urinary tract symptoms: Secondary | ICD-10-CM

## 2012-12-19 DIAGNOSIS — M5431 Sciatica, right side: Secondary | ICD-10-CM

## 2012-12-19 DIAGNOSIS — M5430 Sciatica, unspecified side: Secondary | ICD-10-CM

## 2012-12-19 DIAGNOSIS — Z23 Encounter for immunization: Secondary | ICD-10-CM

## 2012-12-19 DIAGNOSIS — N400 Enlarged prostate without lower urinary tract symptoms: Secondary | ICD-10-CM

## 2012-12-19 DIAGNOSIS — G8929 Other chronic pain: Secondary | ICD-10-CM

## 2012-12-19 DIAGNOSIS — M25519 Pain in unspecified shoulder: Secondary | ICD-10-CM

## 2012-12-19 DIAGNOSIS — R338 Other retention of urine: Secondary | ICD-10-CM

## 2012-12-19 DIAGNOSIS — F329 Major depressive disorder, single episode, unspecified: Secondary | ICD-10-CM

## 2012-12-19 DIAGNOSIS — M25511 Pain in right shoulder: Secondary | ICD-10-CM

## 2012-12-19 DIAGNOSIS — M549 Dorsalgia, unspecified: Secondary | ICD-10-CM

## 2012-12-19 DIAGNOSIS — E785 Hyperlipidemia, unspecified: Secondary | ICD-10-CM

## 2012-12-19 DIAGNOSIS — R339 Retention of urine, unspecified: Secondary | ICD-10-CM

## 2012-12-19 LAB — LIPID PANEL
Cholesterol: 197 mg/dL (ref 0–200)
LDL Cholesterol: 135 mg/dL — ABNORMAL HIGH (ref 0–99)
Triglycerides: 124 mg/dL (ref 0.0–149.0)
VLDL: 24.8 mg/dL (ref 0.0–40.0)

## 2012-12-19 LAB — COMPREHENSIVE METABOLIC PANEL
ALT: 26 U/L (ref 0–53)
AST: 21 U/L (ref 0–37)
Albumin: 4 g/dL (ref 3.5–5.2)
Alkaline Phosphatase: 59 U/L (ref 39–117)
BUN: 13 mg/dL (ref 6–23)
Chloride: 100 mEq/L (ref 96–112)
Creatinine, Ser: 0.8 mg/dL (ref 0.4–1.5)
Potassium: 4 mEq/L (ref 3.5–5.1)

## 2012-12-19 MED ORDER — TRAMADOL HCL 50 MG PO TABS: 50.0000 mg | ORAL_TABLET | Freq: Three times a day (TID) | ORAL | Status: DC | PRN

## 2012-12-19 MED ORDER — GABAPENTIN 300 MG PO CAPS: 300.0000 mg | ORAL_CAPSULE | Freq: Three times a day (TID) | ORAL | Status: DC

## 2012-12-19 MED ORDER — CITALOPRAM HYDROBROMIDE 20 MG PO TABS: 20.0000 mg | ORAL_TABLET | Freq: Every day | ORAL | Status: DC

## 2012-12-19 MED ORDER — MELOXICAM 15 MG PO TABS: ORAL_TABLET | ORAL | Status: DC

## 2012-12-19 MED ORDER — GABAPENTIN 300 MG PO CAPS: ORAL_CAPSULE | ORAL | Status: DC

## 2012-12-19 MED ORDER — TAMSULOSIN HCL 0.4 MG PO CAPS: 0.4000 mg | ORAL_CAPSULE | Freq: Every day | ORAL | Status: DC

## 2012-12-19 NOTE — Progress Notes (Signed)
Patient ID: Jonathon Hill., male   DOB: 05-07-1956, 57 y.o.   MRN: [id number]   Patient Active Problem List  Diagnosis  . Hyperlipidemia  . Benign prostatic hypertrophy with urinary retention  . Cataract of left eye  . Anxiety disorder  . Right-sided S1 radiculitis  . Anxiety  . Cataract  . Bilateral shoulder pain    Subjective:  CC:   Chief Complaint  Patient presents with  . Follow-up    6 month    HPI:   Jonathon Hillis a 57 y.o. male who presents Six-month followup on chronic conditions, including generalized anxiety, benign prostatic hyperplasia and urinary symptoms, and arthralgias.   1) He has been having new Right shoulder pain for 2-3 weeks after injuring it working on trunk. He was lifting something very heavy with his right arm and felt a immediate pain in his shoulder. He's having pain with all abduction flexion and internal rotation. He's been taking gabapentin, tramadol and Mobic with incomplete relief. 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 He is waiting to get an appointment with a sports medicine doctor that treated his left shoulder with a steroid injection several months ago.   2) BPH: He is taking Flomax and finished steroid which has been prescribed by Dr. cope for BPH. His urinary urgency is almost uncontrollable and he is not working. 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 He is sleeping at least 4-5 hours a night without voiding.     3)Sciatica,   his last  MRI showed bulging disc with nerve root impingement on the left side but his symptoms are mostly on the right. He was referred to Dr. Burna Mortimer did not receive steroid injections since he is not having pain at the time of his visit. He states that for the last several weeks he is having increased pain radiating down to the right foot and by the  end of the day he is having trouble extending his foot to use the brake and accelerator while driving. He states of the right leg has become weak and is prone to muscle spasms.  he has had PT evaluation and is continuing to do the home exercises.   Past Medical History  Diagnosis Date  . Hyperlipidemia   . Anxiety   . Cataract     Past Surgical History  Procedure Laterality Date  . Spine surgery  1994, 2004    2 prior lumbar surgeries ([facility])  . Eye surgery  2001    LT, cataract, detached retina 2001,  buckle of sclera  [facility]  . Eye surgery  2004    RT, Cataract  . Eye surgery  2004     Lasik, LT       The following portions of the patient's history were reviewed and updated as appropriate: Allergies, current medications, and problem list.    Review of Systems:   12 Pt  review of systems was negative except those addressed in the HPI,     History   Social History  . Marital Status: Married    Spouse Name: N/A    Number of Children: N/A  . Years of Education: N/A   Occupational History  . Not on file.   Social History Main Topics  . Smoking status: Never Smoker   .  Smokeless tobacco: Never Used  . Alcohol Use: Yes     Comment: rare  . Drug Use: No  . Sexually Active: Not on file   Other Topics Concern  . Not on file   Social History Narrative  . No narrative on file    Objective:  BP 120/80  Pulse 69  Temp(Src) 98.4 F (36.9 C) (Oral)  Resp 16  Wt 228 lb 8 oz (103.647 kg)  BMI 29.33 kg/m2  SpO2 97%  General appearance: alert, cooperative and appears stated age Ears: normal TM's and external ear canals both ears Throat: lips, mucosa, and tongue normal; teeth and gums normal Neck: no adenopathy, no carotid bruit, supple, symmetrical, trachea midline and thyroid not enlarged, symmetric, no tenderness/mass/nodules Back: symmetric, no curvature. ROM normal. No CVA tenderness. Lungs: clear to auscultation bilaterally Heart: regular rate and  rhythm, S1, S2 normal, no murmur, click, rub or gallop Abdomen: soft, non-tender; bowel sounds normal; no masses,  no organomegaly Pulses: 2+ and symmetric Skin: Skin color, texture, turgor normal. No rashes or lesions Lymph nodes: Cervical, supraclavicular, and axillary nodes normal.  Assessment and Plan:  Benign prostatic hypertrophy with urinary retention Symptoms are not adequately controlled on finasteride and Flomax due to urinary urgency. His appointment with Dr. cope in the near future. He may need resection of the prostate.  Right-sided S1 radiculitis I'm referring him back to Dr. Modesto Charon for EMG nerve conduction studies given his new onset right-sided weakness. 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  Hyperlipidemia Improved with reduction in LDL at 30 points attributed to exercise weight loss. He is due for repeat lipids.    Bilateral shoulder pain Presumed seocndary to rotator cuff injury on the right sustained while lifting something very heavy under the truck. He has a sports medicine doctor that I did a prior injection in his left shoulder and is scheduled to see him soon.   Updated Medication List Outpatient Encounter Prescriptions as of 12/19/2012  Medication Sig Dispense Refill  . acetaminophen (TYLENOL) 325 MG tablet Take 650 mg by mouth as needed.        . finasteride (PROSCAR) 5 MG tablet Take 5 mg by mouth daily.      . [DISCONTINUED] citalopram (CELEXA) 20 MG tablet Take 1 tablet (20 mg total) by mouth daily.  90 tablet  3  . [DISCONTINUED] gabapentin (NEURONTIN) 300 MG capsule One tab PO qHS for a week, then BID for a week, then TID.  May increase to a max of 4 tabs PO TID.  180 capsule  3  . [DISCONTINUED] meloxicam (MOBIC) 15 MG tablet One tab PO qAM with breakfast for 2 weeks, then daily prn pain.  30 tablet  3  . [DISCONTINUED] naproxen sodium (ANAPROX) 220 MG tablet Take 220  mg by mouth 2 (two) times daily with a meal.        . [DISCONTINUED] Tamsulosin HCl (FLOMAX) 0.4 MG CAPS Take 1 capsule (0.4 mg total) by mouth daily.  90 capsule  3  . gabapentin (NEURONTIN) 300 MG capsule Take 1 capsule (300 mg total) by mouth 3 (three) times daily.  270 capsule  3  . traMADol (ULTRAM) 50 MG tablet Take 1 tablet (50 mg total) by mouth every 8 (eight) hours as needed for pain.  90 tablet  0   No facility-administered encounter medications on file as of 12/19/2012.     Orders Placed This Encounter  Procedures  . Tdap vaccine greater than or equal to 7yo IM  . Lipid panel  . Comprehensive metabolic panel  . NCV with EMG(electromyography)    No Follow-up on file.

## 2012-12-19 NOTE — Assessment & Plan Note (Signed)
Symptoms are not adequately controlled on finasteride and Flomax due to urinary urgency. His appointment with Dr. cope in the near future. He may need resection of the prostate.

## 2012-12-19 NOTE — Assessment & Plan Note (Addendum)
I'm referring him back to Dr. Modesto Charon for EMG nerve conduction studies given his new onset right-sided weakness. 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

## 2012-12-19 NOTE — Assessment & Plan Note (Signed)
Improved with reduction in LDL at 30 points attributed to exercise weight loss. He is due for repeat lipids.

## 2012-12-19 NOTE — Telephone Encounter (Signed)
Med filled.  

## 2012-12-19 NOTE — Assessment & Plan Note (Signed)
Presumed seocndary to rotator cuff injury on the right sustained while lifting something very heavy under the truck. He has a sports medicine doctor that I did a prior injection in his left shoulder and is scheduled to see him soon.

## 2012-12-20 ENCOUNTER — Encounter: Payer: Self-pay | Admitting: Internal Medicine

## 2012-12-21 ENCOUNTER — Encounter: Payer: Self-pay | Admitting: General Practice

## 2012-12-22 ENCOUNTER — Encounter: Payer: Self-pay | Admitting: Sports Medicine

## 2012-12-22 ENCOUNTER — Ambulatory Visit (INDEPENDENT_AMBULATORY_CARE_PROVIDER_SITE_OTHER): Admitting: Sports Medicine

## 2012-12-22 VITALS — BP 117/68 | HR 77 | Wt 227.0 lb

## 2012-12-22 DIAGNOSIS — M549 Dorsalgia, unspecified: Secondary | ICD-10-CM

## 2012-12-22 DIAGNOSIS — M25512 Pain in left shoulder: Secondary | ICD-10-CM

## 2012-12-22 DIAGNOSIS — M7022 Olecranon bursitis, left elbow: Secondary | ICD-10-CM | POA: Insufficient documentation

## 2012-12-22 DIAGNOSIS — M25511 Pain in right shoulder: Secondary | ICD-10-CM

## 2012-12-22 DIAGNOSIS — M25519 Pain in unspecified shoulder: Secondary | ICD-10-CM

## 2012-12-22 DIAGNOSIS — G8929 Other chronic pain: Secondary | ICD-10-CM

## 2012-12-22 DIAGNOSIS — M7020 Olecranon bursitis, unspecified elbow: Secondary | ICD-10-CM

## 2012-12-22 NOTE — Progress Notes (Signed)
  Subjective:    CC: Followup  HPI: Bilateral shoulder pain: Has bilateral impingement syndrome, I injected his left subacromial bursa with ultrasound-guided 6 months ago, the pain was resolved until now. He also injured his right shoulder and has similar pain. He desires injections into both sides. Pain is localized over the deltoid worse with overhead activities.  Lumbar radiculitis: Likely right-sided S1, went to orthopedic spine surgery, it was also recommended to have an injection. His pain resolved and so he canceled the injection. It is starting to recur particularly when sitting in a flexed position for long periods of time.  Left elbow: Swelling over the past few days, overall improving. Localized over the olecranon, no redness, no pain, no fevers or chills.  Past medical history, Surgical history, Family history not pertinant except as noted below, Social history, Allergies, and medications have been entered into the medical record, reviewed, and no changes needed.   Review of Systems: No headache, visual changes, nausea, vomiting, diarrhea, constipation, dizziness, abdominal pain, skin rash, fevers, chills, night sweats, weight loss, swollen lymph nodes, body aches, joint swelling, muscle aches, chest pain, shortness of breath, mood changes, visual or auditory hallucinations.   Objective:   General: Well Developed, well nourished, and in no acute distress.  Neuro/Psych: Alert and oriented x3, extra-ocular muscles intact, able to move all 4 extremities, sensation grossly intact. Skin: Warm and dry, no rashes noted.  Respiratory: Not using accessory muscles, speaking in full sentences, trachea midline.  Cardiovascular: Pulses palpable, no extremity edema. Abdomen: Does not appear distended.  Procedure: Real-time Ultrasound Guided Injection of left subacromial bursa Device: GE Logiq E  Ultrasound guided injection is preferred based studies that show increased duration, increased  effect, greater accuracy, decreased procedural pain, increased response rate, and decreased cost with ultrasound guided versus blind injection.  Verbal informed consent obtained.  Time-out conducted.  Noted no overlying erythema, induration, or other signs of local infection.  Skin prepped in a sterile fashion.  Local anesthesia: Topical Ethyl chloride.  With sterile technique and under real time ultrasound guidance:  1 cc Kenalog 40, 3 cc lidocaine injected easily into a distended subacromial/subdeltoid bursa. Supraspinatus appeared intact. Completed without difficulty  Pain immediately resolved suggesting accurate placement of the medication.  Advised to call if fevers/chills, erythema, induration, drainage, or persistent bleeding.  Images permanently stored and available for review in the ultrasound unit.  Impression: Technically successful ultrasound guided injection.  Procedure: Real-time Ultrasound Guided Injection of right subacromial bursa Device: GE Logiq E  Ultrasound guided injection is preferred based studies that show increased duration, increased effect, greater accuracy, decreased procedural pain, increased response rate, and decreased cost with ultrasound guided versus blind injection.  Verbal informed consent obtained.  Time-out conducted.  Noted no overlying erythema, induration, or other signs of local infection.  Skin prepped in a sterile fashion.  Local anesthesia: Topical Ethyl chloride.  With sterile technique and under real time ultrasound guidance:  1 cc Kenalog 40, 3 cc lidocaine injected easily into a distended subacromial/subdeltoid bursa. Supraspinatus appeared intact. Completed without difficulty  Pain immediately resolved suggesting accurate placement of the medication.  Advised to call if fevers/chills, erythema, induration, drainage, or persistent bleeding.  Images permanently stored and available for review in the ultrasound unit.  Impression: Technically  successful ultrasound guided injection.  Olecranon visualized, bursitis seen.  Impression and Recommendations:   This case required medical decision making of moderate complexity.

## 2012-12-22 NOTE — Assessment & Plan Note (Addendum)
Does not appear to be septic. Currently resolving. Compressive Ace bandage. Return as needed for this. We can certainly consider injection in the future if needed.

## 2012-12-22 NOTE — Assessment & Plan Note (Signed)
Bilateral impingement syndrome. 6 months of benefit after his last left subacromial injection. Currently both shoulders are hurting, both sides were injected today. Continue home rehabilitation.

## 2012-12-22 NOTE — Assessment & Plan Note (Signed)
He did see Dr. Yevette Edwards who also recommended right-sided L5-S1 transforaminal injection. I'm going to set him up for this with [facility] imaging. He will followup as needed.

## 2012-12-23 ENCOUNTER — Ambulatory Visit
Admission: RE | Admit: 2012-12-23 | Discharge: 2012-12-23 | Disposition: A | Discharge status: Home / Self Care | Source: Ambulatory Visit | Attending: Sports Medicine | Admitting: Sports Medicine

## 2012-12-23 MED ORDER — IOHEXOL 180 MG/ML  SOLN
1.0000 mL | Freq: Once | INTRAMUSCULAR | Status: AC | PRN
  Administered 2012-12-23: 1 mL via EPIDURAL

## 2012-12-23 MED ORDER — METHYLPREDNISOLONE ACETATE 40 MG/ML INJ SUSP (RADIOLOG
120.0000 mg | Freq: Once | INTRAMUSCULAR | Status: AC
  Administered 2012-12-23: 120 mg via EPIDURAL

## 2013-01-24 ENCOUNTER — Ambulatory Visit: Admitting: Sports Medicine

## 2013-01-24 DIAGNOSIS — Z0289 Encounter for other administrative examinations: Secondary | ICD-10-CM

## 2013-03-14 ENCOUNTER — Ambulatory Visit (INDEPENDENT_AMBULATORY_CARE_PROVIDER_SITE_OTHER): Admitting: Sports Medicine

## 2013-03-14 ENCOUNTER — Encounter: Payer: Self-pay | Admitting: Sports Medicine

## 2013-03-14 VITALS — BP 113/65 | HR 73 | Wt 228.0 lb

## 2013-03-14 DIAGNOSIS — G8929 Other chronic pain: Secondary | ICD-10-CM

## 2013-03-14 DIAGNOSIS — M7071 Other bursitis of hip, right hip: Secondary | ICD-10-CM | POA: Insufficient documentation

## 2013-03-14 DIAGNOSIS — M76899 Other specified enthesopathies of unspecified lower limb, excluding foot: Secondary | ICD-10-CM

## 2013-03-14 DIAGNOSIS — M549 Dorsalgia, unspecified: Secondary | ICD-10-CM

## 2013-03-14 DIAGNOSIS — M7022 Olecranon bursitis, left elbow: Secondary | ICD-10-CM

## 2013-03-14 DIAGNOSIS — M25511 Pain in right shoulder: Secondary | ICD-10-CM

## 2013-03-14 NOTE — Progress Notes (Signed)
  Subjective:    CC: Followup  HPI: S1 radiculitis: Status post right sided selective S1 nerve root block, had zero response not even for a brief period of time to selective S1 nerve block. Has not yet followed up with Dr. Yevette Edwards regarding this.  Bilateral shoulder pain: Resolved with bilateral subacromial injections.  Buttock pain: He localizes the pain over the ischial tuberosity, worse with sitting. Pain is localized, doesn't radiate, moderate. Stable.  Olecranon bursitis: Resolved.  Past medical history, Surgical history, Family history not pertinant except as noted below, Social history, Allergies, and medications have been entered into the medical record, reviewed, and no changes needed.   Review of Systems: No fevers, chills, night sweats, weight loss, chest pain, or shortness of breath.   Objective:    General: Well Developed, well nourished, and in no acute distress.  Neuro: Alert and oriented x3, extra-ocular muscles intact, sensation grossly intact.  HEENT: Normocephalic, atraumatic, pupils equal round reactive to light, neck supple, no masses, no lymphadenopathy, thyroid nonpalpable.  Skin: Warm and dry, no rashes. Cardiac: Regular rate and rhythm, no murmurs rubs or gallops, no lower extremity edema.  Respiratory: Clear to auscultation bilaterally. Not using accessory muscles, speaking in full sentences. Bilateral Shoulder: Inspection reveals no abnormalities, atrophy or asymmetry. Palpation is normal with no tenderness over AC joint or bicipital groove. ROM is full in all planes. Rotator cuff strength normal throughout. No signs of impingement with negative Neer and Hawkin's tests, empty can sign. Speeds and Yergason's tests normal. No labral pathology noted with negative Obrien's, negative clunk and good stability. Normal scapular function observed. No painful arc and no drop arm sign. No apprehension sign  Tenderness to palpation over right ischial  tuberosity.  Procedure:  Injection of right ischial tuberosity Consent obtained and verified. Time-out conducted. Noted no overlying erythema, induration, or other signs of local infection. Skin prepped in a sterile fashion. Topical analgesic spray: Ethyl chloride. Completed without difficulty. Meds: Spinal needle advanced to the ischial tubercle, 1 cc Kenalog 40, 4 cc lidocaine injected in a fanlike pattern. Pain immediately improved suggesting accurate placement of the medication. Advised to call if fevers/chills, erythema, induration, drainage, or persistent bleeding.  Impression and Recommendations:

## 2013-03-14 NOTE — Assessment & Plan Note (Signed)
Unfortunately no response to selective S1 nerve block. He will followup Dr. Yevette Edwards regarding this.

## 2013-03-14 NOTE — Assessment & Plan Note (Signed)
Currently was on that for bilateral subacromial injections.

## 2013-03-14 NOTE — Assessment & Plan Note (Signed)
Injection as above. Fortunately this relieved the amount of his symptoms. Return in one month for this.

## 2013-03-14 NOTE — Assessment & Plan Note (Signed)
Resolved

## 2013-03-17 ENCOUNTER — Other Ambulatory Visit: Payer: Self-pay | Admitting: Internal Medicine

## 2013-03-17 MED ORDER — TRAMADOL HCL 50 MG PO TABS: 50.0000 mg | ORAL_TABLET | Freq: Three times a day (TID) | ORAL | Status: DC | PRN

## 2013-04-11 ENCOUNTER — Ambulatory Visit (INDEPENDENT_AMBULATORY_CARE_PROVIDER_SITE_OTHER): Admitting: Sports Medicine

## 2013-04-11 ENCOUNTER — Encounter: Payer: Self-pay | Admitting: Sports Medicine

## 2013-04-11 VITALS — BP 125/72 | HR 86 | Wt 217.0 lb

## 2013-04-11 DIAGNOSIS — G8929 Other chronic pain: Secondary | ICD-10-CM

## 2013-04-11 DIAGNOSIS — M549 Dorsalgia, unspecified: Secondary | ICD-10-CM

## 2013-04-11 MED ORDER — GABAPENTIN 600 MG PO TABS: ORAL_TABLET | ORAL | Status: DC

## 2013-04-11 MED ORDER — MELOXICAM 15 MG PO TABS: 15.0000 mg | ORAL_TABLET | Freq: Every day | ORAL | Status: DC

## 2013-04-11 MED ORDER — TRAMADOL HCL 50 MG PO TABS: 50.0000 mg | ORAL_TABLET | Freq: Three times a day (TID) | ORAL | Status: DC | PRN

## 2013-04-11 NOTE — Progress Notes (Signed)
  Subjective:    CC: Follow up  HPI: Right lumbar radiculitis: Jonathon Hill has known multilevel degenerative disc disease, he still has an appointment coming up with Dr. Yevette Edwards. He had a selective right-sided S1 nerve root block which provided no symptom relief. At the last visit he was having some pain over his ischial bursa we injected this, and he noted no relief. 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 Currently he is off work, and pain is much improved. He is only taking 300 mg of gabapentin per day, as well as Mobic and tramadol. Symptoms are moderate, persistent.  Past medical history, Surgical history, Family history not pertinant except as noted below, Social history, Allergies, and medications have been entered into the medical record, reviewed, and no changes needed.   Review of Systems: No fevers, chills, night sweats, weight loss, chest pain, or shortness of breath.   Objective:    General: Well Developed, well nourished, and in no acute distress.  Neuro: Alert and oriented x3, extra-ocular muscles intact, sensation grossly intact.  HEENT: Normocephalic, atraumatic, pupils equal round reactive to light, neck supple, no masses, no lymphadenopathy, thyroid nonpalpable.  Skin: Warm and dry, no rashes. Cardiac: Regular rate and rhythm, no murmurs rubs or gallops, no lower extremity edema.  Respiratory: Clear to auscultation bilaterally. Not using accessory muscles, speaking in full sentences. Impression and Recommendations:

## 2013-04-11 NOTE — Assessment & Plan Note (Signed)
Previous epidural injection was a right selective S1 nerve root block which provided zero benefit. I do want him to ramp up the gabapentin to near the max dose. As the previous epidural was a right selective S1 nerve root block, and he does have a predominant disc fusion at the L5-S1 level as well as the L4-L5 level, I would certainly, at some point like to try a right-sided transforaminal L5-S1 epidural injection. We will likely do this the beginning of October. I will await [place] call. He should certainly keep his appointment with Dr. Yevette Edwards with spine surgery.

## 2013-04-13 ENCOUNTER — Ambulatory Visit: Admitting: Sports Medicine

## 2013-04-14 ENCOUNTER — Ambulatory Visit: Admitting: Sports Medicine

## 2013-05-09 ENCOUNTER — Ambulatory Visit (INDEPENDENT_AMBULATORY_CARE_PROVIDER_SITE_OTHER): Admitting: Sports Medicine

## 2013-05-09 ENCOUNTER — Encounter: Payer: Self-pay | Admitting: Sports Medicine

## 2013-05-09 ENCOUNTER — Ambulatory Visit (INDEPENDENT_AMBULATORY_CARE_PROVIDER_SITE_OTHER)

## 2013-05-09 VITALS — BP 121/76 | HR 73 | Wt 217.0 lb

## 2013-05-09 DIAGNOSIS — M79609 Pain in unspecified limb: Secondary | ICD-10-CM

## 2013-05-09 DIAGNOSIS — M7730 Calcaneal spur, unspecified foot: Secondary | ICD-10-CM

## 2013-05-09 DIAGNOSIS — M79672 Pain in left foot: Secondary | ICD-10-CM | POA: Insufficient documentation

## 2013-05-09 DIAGNOSIS — M19079 Primary osteoarthritis, unspecified ankle and foot: Secondary | ICD-10-CM

## 2013-05-09 DIAGNOSIS — M25519 Pain in unspecified shoulder: Secondary | ICD-10-CM

## 2013-05-09 DIAGNOSIS — M25512 Pain in left shoulder: Secondary | ICD-10-CM

## 2013-05-09 DIAGNOSIS — M25511 Pain in right shoulder: Secondary | ICD-10-CM

## 2013-05-09 NOTE — Assessment & Plan Note (Signed)
Right-sided rotator cuff strain. Injection as above. Return as needed.

## 2013-05-09 NOTE — Progress Notes (Signed)
  Subjective:    CC: Recent fall  HPI: Right shoulder: Jonathon Hill has bilateral rotator cuff dysfunction: Approximately 5 months ago I performed bilateral subacromial injections, his pain has been relieved until his recent fall a few days ago. Now he has pain he localizes of the deltoid, worse with overhead activities. Is moderate, persistent. No radiation.  Left heel pain: This also occurred after his fall, he now has pain he localizes on the plantar aspect of his calcaneus, worse with weight bearing, no radiation. He never got any swelling or bruising. Wonders if it's broken.  Past medical history, Surgical history, Family history not pertinant except as noted below, Social history, Allergies, and medications have been entered into the medical record, reviewed, and no changes needed.   Review of Systems: No fevers, chills, night sweats, weight loss, chest pain, or shortness of breath.   Objective:    General: Well Developed, well nourished, and in no acute distress.  Neuro: Alert and oriented x3, extra-ocular muscles intact, sensation grossly intact.  HEENT: Normocephalic, atraumatic, pupils equal round reactive to light, neck supple, no masses, no lymphadenopathy, thyroid nonpalpable.  Skin: Warm and dry, no rashes. Cardiac: Regular rate and rhythm, no murmurs rubs or gallops, no lower extremity edema.  Respiratory: Clear to auscultation bilaterally. Not using accessory muscles, speaking in full sentences. Right Shoulder: Inspection reveals no abnormalities, atrophy or asymmetry. Palpation is normal with no tenderness over AC joint or bicipital groove. ROM is full in all planes. Rotator cuff strength normal throughout. Positive Hawkins test, negative Neer and Empty Can signs, excellent rotator cuff strength. Speeds and Yergason's tests normal. No labral pathology noted with negative Obrien's, negative clunk and good stability. Normal scapular function observed. No painful arc and no drop  arm sign. No apprehension sign Right Foot: No visible erythema or swelling. Range of motion is full in all directions. Strength is 5/5 in all directions. No hallux valgus. No pes cavus or pes planus. No abnormal callus noted. No pain over the navicular prominence, or base of fifth metatarsal. Mild tenderness to palpation over the plantar calcaneus. No pain at the Achilles insertion. No pain over the calcaneal bursa. No pain of the retrocalcaneal bursa. No tenderness to palpation over the tarsals, metatarsals, or phalanges. No hallux rigidus or limitus. No tenderness palpation over interphalangeal joints. No pain with compression of the metatarsal heads. Neurovascularly intact distally.  Procedure: Real-time Ultrasound Guided Injection of right subacromial bursa Device: GE Logiq E  Verbal informed consent obtained.  Time-out conducted.  Noted no overlying erythema, induration, or other signs of local infection.  Skin prepped in a sterile fashion.  Local anesthesia: Topical Ethyl chloride.  With sterile technique and under real time ultrasound guidance:  Distended subacromial subdeltoid bursa was seen, needle advanced and 1 cc Kenalog 40, 4 cc lidocaine injected easily. Completed without difficulty  Pain immediately resolved suggesting accurate placement of the medication.  Advised to call if fevers/chills, erythema, induration, drainage, or persistent bleeding.  Images permanently stored and available for review in the ultrasound unit.  Impression: Technically successful ultrasound guided injection.  X-rays of the foot were reviewed, there are degenerative changes particularly of the first metatarsophalangeal joint, there is no sign of the calcaneal fracture.  Impression and Recommendations:

## 2013-05-09 NOTE — Assessment & Plan Note (Signed)
Pain is likely related to contusion over the plantar aspect of the calcaneus. X-rays. Heel lift. Return if no better in a month.

## 2013-06-06 ENCOUNTER — Ambulatory Visit: Admitting: Sports Medicine

## 2013-06-12 ENCOUNTER — Ambulatory Visit (INDEPENDENT_AMBULATORY_CARE_PROVIDER_SITE_OTHER): Admitting: Sports Medicine

## 2013-06-12 ENCOUNTER — Encounter: Payer: Self-pay | Admitting: Sports Medicine

## 2013-06-12 ENCOUNTER — Ambulatory Visit (INDEPENDENT_AMBULATORY_CARE_PROVIDER_SITE_OTHER)

## 2013-06-12 VITALS — BP 127/81 | HR 66 | Wt 218.0 lb

## 2013-06-12 DIAGNOSIS — M19049 Primary osteoarthritis, unspecified hand: Secondary | ICD-10-CM | POA: Insufficient documentation

## 2013-06-12 DIAGNOSIS — G8929 Other chronic pain: Secondary | ICD-10-CM

## 2013-06-12 DIAGNOSIS — M159 Polyosteoarthritis, unspecified: Secondary | ICD-10-CM

## 2013-06-12 DIAGNOSIS — S60459A Superficial foreign body of unspecified finger, initial encounter: Secondary | ICD-10-CM

## 2013-06-12 DIAGNOSIS — M79672 Pain in left foot: Secondary | ICD-10-CM

## 2013-06-12 DIAGNOSIS — M25511 Pain in right shoulder: Secondary | ICD-10-CM

## 2013-06-12 NOTE — Assessment & Plan Note (Signed)
Resolved with a heel lift bilaterally.

## 2013-06-12 NOTE — Assessment & Plan Note (Signed)
Resolved after injection. Return as needed. 

## 2013-06-12 NOTE — Assessment & Plan Note (Signed)
Stable

## 2013-06-12 NOTE — Progress Notes (Signed)
  Subjective:    CC: Follow up  HPI: Lumbar degenerative disc disease: Improved after epidural in the past, overall doing okay, does not desire to pursue any further interventional treatment.  Shoulder pain: Resolved after ultrasound guided injection, happy with the results so far.  Right hand pain: Recalls punching something decades ago, had a fracture of his fourth metacarpal, this was never treated, he ended up with a rotational deformity, and now has persistent pain at the right fourth metacarpophalangeal joint. Symptoms are moderate, persistent, desires interventional treatment but not today.  Past medical history, Surgical history, Family history not pertinant except as noted below, Social history, Allergies, and medications have been entered into the medical record, reviewed, and no changes needed.   Review of Systems: No fevers, chills, night sweats, weight loss, chest pain, or shortness of breath.   Objective:    General: Well Developed, well nourished, and in no acute distress.  Neuro: Alert and oriented x3, extra-ocular muscles intact, sensation grossly intact.  HEENT: Normocephalic, atraumatic, pupils equal round reactive to light, neck supple, no masses, no lymphadenopathy, thyroid nonpalpable.  Skin: Warm and dry, no rashes. Cardiac: Regular rate and rhythm, no murmurs rubs or gallops, no lower extremity edema.  Respiratory: Clear to auscultation bilaterally. Not using accessory muscles, speaking in full sentences. Right hand: There is shortening of the fourth metacarpal shaft, there is also bony callus, with what probably was a dorsally angulated fracture. There is tenderness to palpation over the MCP.  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  Impression and Recommendations:

## 2013-06-12 NOTE — Assessment & Plan Note (Signed)
Afterward appears to be an angulated fracture of his right fourth metacarpal in the distant past. He now has posttraumatic degenerative changes in the knuckle. X-rays, he will return for injection.

## 2013-07-04 ENCOUNTER — Ambulatory Visit: Admitting: Sports Medicine

## 2013-07-10 ENCOUNTER — Ambulatory Visit (INDEPENDENT_AMBULATORY_CARE_PROVIDER_SITE_OTHER): Admitting: Sports Medicine

## 2013-07-10 ENCOUNTER — Encounter: Payer: Self-pay | Admitting: Sports Medicine

## 2013-07-10 VITALS — BP 118/72 | HR 76 | Wt 229.0 lb

## 2013-07-10 DIAGNOSIS — M19049 Primary osteoarthritis, unspecified hand: Secondary | ICD-10-CM

## 2013-07-10 DIAGNOSIS — M25519 Pain in unspecified shoulder: Secondary | ICD-10-CM

## 2013-07-10 DIAGNOSIS — M25511 Pain in right shoulder: Secondary | ICD-10-CM

## 2013-07-10 MED ORDER — HYDROCODONE-ACETAMINOPHEN 7.5-500 MG PO TABS: 1.0000 | ORAL_TABLET | Freq: Four times a day (QID) | ORAL | Status: AC | PRN

## 2013-07-10 NOTE — Progress Notes (Signed)
  Subjective:    CC: Osteoarthritis  HPI: Bilateral rotator cuff syndrome: Injected approximately 2 months ago to the right shoulder, pain is starting to recur.  Lumbar radiculopathy: stable with gabapentin. Epidurals were ineffective.  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 We discussed interventional treatment at the last visit and he desires this today.  Past medical history, Surgical history, Family history not pertinant except as noted below, Social history, Allergies, and medications have been entered into the medical record, reviewed, and no changes needed.   Review of Systems: No fevers, chills, night sweats, weight loss, chest pain, or shortness of breath.   Objective:    General: Well Developed, well nourished, and in no acute distress.  Neuro: Alert and oriented x3, extra-ocular muscles intact, sensation grossly intact.  HEENT: Normocephalic, atraumatic, pupils equal round reactive to light, neck supple, no masses, no lymphadenopathy, thyroid nonpalpable.  Skin: Warm and dry, no rashes. Cardiac: Regular rate and rhythm, no murmurs rubs or gallops, no lower extremity edema.  Respiratory: Clear to auscultation bilaterally. Not using accessory muscles, speaking in full sentences.  Procedure: Real-time Ultrasound Guided Injection of right fourth metacarpophalangeal joint. Device: GE Logiq E  Verbal informed consent obtained.  Time-out conducted.  Noted no overlying erythema, induration, or other signs of local infection.  Skin prepped in a sterile fashion.  Local anesthesia: Topical Ethyl chloride.  With sterile technique and under real time ultrasound guidance:  0.5 cc Kenalog 40, 0.5 cc lidocaine injected easily into the joint. Completed without difficulty  Pain immediately resolved suggesting accurate placement of the medication.  Advised to call if fevers/chills, erythema,  induration, drainage, or persistent bleeding.  Images permanently stored and available for review in the ultrasound unit.  Impression: Technically successful ultrasound guided injection.  Procedure: Real-time Ultrasound Guided Injection of right third metacarpophalangeal joint. Device: GE Logiq E  Verbal informed consent obtained.  Time-out conducted.  Noted no overlying erythema, induration, or other signs of local infection.  Skin prepped in a sterile fashion.  Local anesthesia: Topical Ethyl chloride.  With sterile technique and under real time ultrasound guidance:  0.5 cc Kenalog 40, 0.5 cc lidocaine injected easily into the joint. Completed without difficulty  Pain immediately resolved suggesting accurate placement of the medication.  Advised to call if fevers/chills, erythema, induration, drainage, or persistent bleeding.  Images permanently stored and available for review in the ultrasound unit.  Impression: Technically successful ultrasound guided injection.  Impression and Recommendations:

## 2013-07-10 NOTE — Assessment & Plan Note (Signed)
Injected right fourth and fifth metacarpophalangeal joints. Return in one month for this. Refilling hydrocodone.

## 2013-07-10 NOTE — Assessment & Plan Note (Signed)
Right shoulder pain is returning 2 months after the injection. He will get more aggressive with his exercises and we can touch base in another month regarding this.

## 2013-07-17 ENCOUNTER — Ambulatory Visit: Admitting: Sports Medicine

## 2013-07-28 ENCOUNTER — Encounter: Payer: Self-pay | Admitting: *Deleted

## 2013-07-31 ENCOUNTER — Encounter: Payer: Self-pay | Admitting: Internal Medicine

## 2013-07-31 ENCOUNTER — Ambulatory Visit (INDEPENDENT_AMBULATORY_CARE_PROVIDER_SITE_OTHER): Admitting: Internal Medicine

## 2013-07-31 VITALS — BP 118/80 | HR 63 | Temp 97.7°F | Resp 12 | Wt 226.5 lb

## 2013-07-31 DIAGNOSIS — R339 Retention of urine, unspecified: Secondary | ICD-10-CM

## 2013-07-31 DIAGNOSIS — Z79899 Other long term (current) drug therapy: Secondary | ICD-10-CM

## 2013-07-31 DIAGNOSIS — F419 Anxiety disorder, unspecified: Secondary | ICD-10-CM

## 2013-07-31 DIAGNOSIS — F411 Generalized anxiety disorder: Secondary | ICD-10-CM

## 2013-07-31 DIAGNOSIS — Z23 Encounter for immunization: Secondary | ICD-10-CM

## 2013-07-31 DIAGNOSIS — Z6825 Body mass index (BMI) 25.0-25.9, adult: Secondary | ICD-10-CM

## 2013-07-31 DIAGNOSIS — N401 Enlarged prostate with lower urinary tract symptoms: Secondary | ICD-10-CM

## 2013-07-31 DIAGNOSIS — E785 Hyperlipidemia, unspecified: Secondary | ICD-10-CM

## 2013-07-31 DIAGNOSIS — E663 Overweight: Secondary | ICD-10-CM

## 2013-07-31 LAB — LDL CHOLESTEROL, DIRECT: Direct LDL: 169.2 mg/dL

## 2013-07-31 LAB — COMPREHENSIVE METABOLIC PANEL
ALT: 26 U/L (ref 0–53)
AST: 21 U/L (ref 0–37)
CO2: 29 mEq/L (ref 19–32)
Calcium: 9 mg/dL (ref 8.4–10.5)
Chloride: 103 mEq/L (ref 96–112)
Creatinine, Ser: 0.8 mg/dL (ref 0.4–1.5)
GFR: 112.78 mL/min (ref >60.00)
Glucose, Bld: 90 mg/dL (ref 70–99)
Potassium: 4.2 mEq/L (ref 3.5–5.1)
Sodium: 140 mEq/L (ref 135–145)
Total Protein: 7.2 g/dL (ref 6.0–8.3)

## 2013-07-31 LAB — LIPID PANEL: HDL: 56.5 mg/dL (ref >39.00)

## 2013-07-31 MED ORDER — ALPRAZOLAM 0.5 MG PO TABS: 0.5000 mg | ORAL_TABLET | Freq: Every evening | ORAL | Status: DC | PRN

## 2013-07-31 NOTE — Assessment & Plan Note (Addendum)
Daytime symptoms are well-controlled with citalopram. He continues to have trouble with insomnia both with sleep initiation and occasional early morning wakening .  Discussed a trial of very low-dose alprazolam for sleep initiation or early morning wakening. Risks and benefits of medication discussed.

## 2013-07-31 NOTE — Progress Notes (Signed)
Patient ID: Jonathon Laura., male   DOB: 01/10/1956, 57 y.o.   MRN: [id number]   Patient Active Problem List   Diagnosis Date Noted  . Overweight (BMI 25.0-29.9) 08/01/2013  . Hand arthritis 06/12/2013  . Pain of left heel 05/09/2013  . Bilateral shoulder pain 06/20/2012  . Anxiety   . Cataract   . Right lumbar radiculitis 06/28/2011  . Anxiety disorder 05/30/2011  . Hyperlipidemia 05/29/2011  . Benign prostatic hypertrophy with urinary retention 05/29/2011  . Cataract of left eye 05/29/2011    Subjective:  CC:   Chief Complaint  Patient presents with  . Follow-up    6 month    HPI:   Jonathon Hillis a 57 y.o. male who presents 6 month follow up generalized anxiety,  BPH, and joint pain.  He continues to have chronic pain from multiple joint disorders and secondary to  OA and DJD,  Seeing  an orthopedist for intra-articular injections and  pain control with his prescription for vicodin  refilled .  Still having a lot of pain in both hand,s  R > L .  The shoulder has responded to injections but recent fall has made the right shoulder problematic.  He is taking his medications as directed and has no side effects from any of them. He is trying to lose weight with exercise and diet. His ability to exercise is limited by his joint pain. He has been spending a considerable amount of time on the road in his RV with his wife visiting their daughter.  He admits that there is ahabits have been somewhat noncompliant due to this lifestyle.     Past Medical History  Diagnosis Date  . Hyperlipidemia   . Anxiety   . Cataract     Past Surgical History  Procedure Laterality Date  . Spine surgery  1994, 2004    2 prior lumbar surgeries ([facility])  . Eye surgery  2001    LT, cataract, detached retina 2001,  buckle of sclera  [facility]  . Eye surgery  2004    RT, Cataract  . Eye surgery  2004     Lasik, LT       The following portions of the patient's history were  reviewed and updated as appropriate: Allergies, current medications, and problem list.    Review of Systems:   12 Pt  review of systems was negative except those addressed in the HPI,     History   Social History  . Marital Status: Married    Spouse Name: N/A    Number of Children: N/A  . Years of Education: N/A   Occupational History  . Not on file.   Social History Main Topics  . Smoking status: Never Smoker   . Smokeless tobacco: Never Used  . Alcohol Use: Yes     Comment: rare  . Drug Use: No  . Sexual Activity: Yes   Other Topics Concern  . Not on file   Social History Narrative  . No narrative on file    Objective:  Filed Vitals:   07/31/13 1112  BP: 118/80  Pulse: 63  Temp: 97.7 F (36.5 C)  Resp: 12     General appearance: alert, cooperative and appears stated age Ears: normal TM's and external ear canals both ears Throat: lips, mucosa, and tongue normal; teeth and gums normal Neck: no adenopathy, no carotid bruit, supple, symmetrical, trachea midline and thyroid not enlarged, symmetric, no tenderness/mass/nodules Back: symmetric,  no curvature. ROM normal. No CVA tenderness. Lungs: clear to auscultation bilaterally Heart: regular rate and rhythm, S1, S2 normal, no murmur, click, rub or gallop Abdomen: soft, non-tender; bowel sounds normal; no masses,  no organomegaly Pulses: 2+ and symmetric Skin: Skin color, texture, turgor normal. No rashes or lesions Lymph nodes: Cervical, supraclavicular, and axillary nodes normal.  Assessment and Plan:  Anxiety disorder Daytime symptoms are well-controlled with citalopram. He continues to have trouble with insomnia both with sleep initiation and occasional early morning wakening .  Discussed a trial of very low-dose alprazolam for sleep initiation or early morning wakening. Risks and benefits of medication discussed.  Benign prostatic hypertrophy with urinary retention Managed by Dr. cope with  tamsulosin. PSAs are being checked annually by Dr. cope.  Hyperlipidemia He has been asked to return for fasting lipid panel. He has preferred to manage his lipids with diet alone.  Overweight (BMI 25.0-29.9) I have addressed  BMI and recommended a low glycemic index diet utilizing smaller more frequent meals to increase metabolism.  I have also recommended that patient start exercising with a goal of 30 minutes of aerobic exercise a minimum of 5 days per week. Screening for lipid disorders, thyroid and diabetes to be done today.     Updated Medication List Outpatient Encounter Prescriptions as of 07/31/2013  Medication Sig Dispense Refill  . acetaminophen (TYLENOL) 325 MG tablet Take 650 mg by mouth as needed.        . citalopram (CELEXA) 20 MG tablet Take 1 tablet (20 mg total) by mouth daily.  90 tablet  3  . finasteride (PROSCAR) 5 MG tablet Take 5 mg by mouth daily.      Marland Kitchen gabapentin (NEURONTIN) 600 MG tablet Two tabs PO TID  270 tablet  3  . meloxicam (MOBIC) 15 MG tablet Take 1 tablet (15 mg total) by mouth daily.  90 tablet  3  . tamsulosin (FLOMAX) 0.4 MG CAPS Take 1 capsule (0.4 mg total) by mouth daily.  90 capsule  3  . traMADol (ULTRAM) 50 MG tablet Take 1 tablet (50 mg total) by mouth every 8 (eight) hours as needed for pain.  90 tablet  2  . ALPRAZolam (XANAX) 0.5 MG tablet Take 1 tablet (0.5 mg total) by mouth at bedtime as needed for sleep.  30 tablet  0  . [DISCONTINUED] ALPRAZolam (XANAX) 0.5 MG tablet Take 1 tablet (0.5 mg total) by mouth at bedtime as needed for sleep.  30 tablet  0   No facility-administered encounter medications on file as of 07/31/2013.     Orders Placed This Encounter  Procedures  . Flu Vaccine QUAD 36+ mos IM  . Comprehensive metabolic panel  . Lipid panel  . LDL cholesterol, direct    No Follow-up on file.

## 2013-08-01 ENCOUNTER — Encounter: Payer: Self-pay | Admitting: Internal Medicine

## 2013-08-01 DIAGNOSIS — E663 Overweight: Secondary | ICD-10-CM | POA: Insufficient documentation

## 2013-08-01 NOTE — Assessment & Plan Note (Signed)
I have addressed  BMI and recommended a low glycemic index diet utilizing smaller more frequent meals to increase metabolism.  I have also recommended that patient start exercising with a goal of 30 minutes of aerobic exercise a minimum of 5 days per week. Screening for lipid disorders, thyroid and diabetes to be done today.   

## 2013-08-01 NOTE — Assessment & Plan Note (Signed)
He has been asked to return for fasting lipid panel. He has preferred to manage his lipids with diet alone.

## 2013-08-01 NOTE — Assessment & Plan Note (Signed)
Managed by Dr. cope with tamsulosin. PSAs are being checked annually by Dr. cope.   

## 2013-08-04 ENCOUNTER — Ambulatory Visit: Admitting: Internal Medicine

## 2013-08-07 ENCOUNTER — Encounter: Payer: Self-pay | Admitting: Sports Medicine

## 2013-08-07 ENCOUNTER — Ambulatory Visit (INDEPENDENT_AMBULATORY_CARE_PROVIDER_SITE_OTHER): Admitting: Sports Medicine

## 2013-08-07 VITALS — BP 131/72 | HR 82 | Wt 226.0 lb

## 2013-08-07 DIAGNOSIS — M25519 Pain in unspecified shoulder: Secondary | ICD-10-CM

## 2013-08-07 DIAGNOSIS — M25511 Pain in right shoulder: Secondary | ICD-10-CM

## 2013-08-07 NOTE — Assessment & Plan Note (Signed)
Left shoulder is doing okay, injected right subacromial bursa under guidance. Formal physical therapy in the hopes of improving his rotator cuff strength, and decreasing his need for further injections. Return in about 2 months.

## 2013-08-07 NOTE — Progress Notes (Signed)
  Subjective:    CC: Followup.  HPI: Rotator Cuff Syndrome:  Left shoulder is doing great, right shoulder is starting to hurt again, last injection was approximately 3 months ago. He is really not doing his rehabilitation exercises. Pain is localized, moderate, persistent, worse over the deltoid, worse with overhead activities.  Past medical history, Surgical history, Family history not pertinant except as noted below, Social history, Allergies, and medications have been entered into the medical record, reviewed, and no changes needed.   Review of Systems: No fevers, chills, night sweats, weight loss, chest pain, or shortness of breath.   Objective:    General: Well Developed, well nourished, and in no acute distress.  Neuro: Alert and oriented x3, extra-ocular muscles intact, sensation grossly intact.  HEENT: Normocephalic, atraumatic, pupils equal round reactive to light, neck supple, no masses, no lymphadenopathy, thyroid nonpalpable.  Skin: Warm and dry, no rashes. Cardiac: Regular rate and rhythm, no murmurs rubs or gallops, no lower extremity edema.  Respiratory: Clear to auscultation bilaterally. Not using accessory muscles, speaking in full sentences.  Procedure: Real-time Ultrasound Guided Injection of right subacromial bursa Device: GE Logiq E  Verbal informed consent obtained.  Time-out conducted.  Noted no overlying erythema, induration, or other signs of local infection.  Skin prepped in a sterile fashion.  Local anesthesia: Topical Ethyl chloride.  With sterile technique and under real time ultrasound guidance:  25-gauge needle advanced into the bursa, 1 cc Kenalog 40, 4 cc lidocaine injected easily. Completed without difficulty  Pain immediately resolved suggesting accurate placement of the medication.  Advised to call if fevers/chills, erythema, induration, drainage, or persistent bleeding.  Images permanently stored and available for review in the ultrasound unit.    Impression: Technically successful ultrasound guided injection.  Impression and Recommendations:

## 2013-08-28 ENCOUNTER — Ambulatory Visit

## 2013-10-09 ENCOUNTER — Ambulatory Visit: Admitting: Sports Medicine

## 2014-01-23 ENCOUNTER — Ambulatory Visit (INDEPENDENT_AMBULATORY_CARE_PROVIDER_SITE_OTHER): Admitting: Sports Medicine

## 2014-01-23 ENCOUNTER — Encounter: Payer: Self-pay | Admitting: Sports Medicine

## 2014-01-23 VITALS — BP 138/73 | HR 84 | Ht 74.0 in | Wt 235.0 lb

## 2014-01-23 DIAGNOSIS — M19049 Primary osteoarthritis, unspecified hand: Secondary | ICD-10-CM

## 2014-01-23 DIAGNOSIS — M25519 Pain in unspecified shoulder: Secondary | ICD-10-CM

## 2014-01-23 DIAGNOSIS — M25512 Pain in left shoulder: Secondary | ICD-10-CM

## 2014-01-23 DIAGNOSIS — M25511 Pain in right shoulder: Secondary | ICD-10-CM

## 2014-01-23 MED ORDER — LORAZEPAM 2 MG PO TABS: ORAL_TABLET | ORAL | Status: DC

## 2014-01-23 NOTE — Assessment & Plan Note (Signed)
Bilateral subacromial injections as above. Return as needed.

## 2014-01-23 NOTE — Progress Notes (Signed)
Subjective:    CC: Followup  HPI: Bilateral shoulder pain: Has had subacromial injections in the past, these have worked extremely well, desires bilateral interventional treatment today, pain is located over the deltoid, moderate, persistent, worse with overhead activities.  Right hand pain: Hand osteoarthritis, last injection was over 7 months ago into the third and fourth metacarpophalangeal joints. Desires a repeat injection today.  Past medical history, Surgical history, Family history not pertinant except as noted below, Social history, Allergies, and medications have been entered into the medical record, reviewed, and no changes needed.   Review of Systems: No fevers, chills, night sweats, weight loss, chest pain, or shortness of breath.   Objective:    General: Well Developed, well nourished, and in no acute distress.  Neuro: Alert and oriented x3, extra-ocular muscles intact, sensation grossly intact.  HEENT: Normocephalic, atraumatic, pupils equal round reactive to light, neck supple, no masses, no lymphadenopathy, thyroid nonpalpable.  Skin: Warm and dry, no rashes. Cardiac: Regular rate and rhythm, no murmurs rubs or gallops, no lower extremity edema.  Respiratory: Clear to auscultation bilaterally. Not using accessory muscles, speaking in full sentences.  Procedure: Real-time Ultrasound Guided Injection of right subacromial bursa Device: GE Logiq E  Verbal informed consent obtained.  Time-out conducted.  Noted no overlying erythema, induration, or other signs of local infection.  Skin prepped in a sterile fashion.  Local anesthesia: Topical Ethyl chloride.  With sterile technique and under real time ultrasound guidance:  Noted distended bursa, 25-gauge needle advanced, 1 cc Kenalog 40, 4 cc lidocaine injected easily. Completed without difficulty  Pain immediately resolved suggesting accurate placement of the medication.  Advised to call if fevers/chills, erythema,  induration, drainage, or persistent bleeding.  Images permanently stored and available for review in the ultrasound unit.  Impression: Technically successful ultrasound guided injection.  Procedure: Real-time Ultrasound Guided Injection of left subacromial bursa Device: GE Logiq E  Verbal informed consent obtained.  Time-out conducted.  Noted no overlying erythema, induration, or other signs of local infection.  Skin prepped in a sterile fashion.  Local anesthesia: Topical Ethyl chloride.  With sterile technique and under real time ultrasound guidance:  Noted distended bursa, 25-gauge needle advanced, 1 cc Kenalog 40, 4 cc lidocaine injected easily. Completed without difficulty  Pain immediately resolved suggesting accurate placement of the medication.  Advised to call if fevers/chills, erythema, induration, drainage, or persistent bleeding.  Images permanently stored and available for review in the ultrasound unit.  Impression: Technically successful ultrasound guided injection.  Procedure: Real-time Ultrasound Guided Injection of right third metacarpophalangeal joint Device: GE Logiq E  Verbal informed consent obtained.  Time-out conducted.  Noted no overlying erythema, induration, or other signs of local infection.  Skin prepped in a sterile fashion.  Local anesthesia: Topical Ethyl chloride.  With sterile technique and under real time ultrasound guidance:  25-gauge needle advanced into joint, 0.5 cc Kenalog 40, 0.5 cc lidocaine injected easily. Completed without difficulty  Pain immediately resolved suggesting accurate placement of the medication.  Advised to call if fevers/chills, erythema, induration, drainage, or persistent bleeding.  Images permanently stored and available for review in the ultrasound unit.  Impression: Technically successful ultrasound guided injection.  Procedure: Real-time Ultrasound Guided Injection of right fourth metacarpophalangeal joint Device: GE  Logiq E  Verbal informed consent obtained.  Time-out conducted.  Noted no overlying erythema, induration, or other signs of local infection.  Skin prepped in a sterile fashion.  Local anesthesia: Topical Ethyl chloride.  With sterile technique  and under real time ultrasound guidance:  25-gauge needle advanced into joint, 0.5 cc Kenalog 40, 0.5 cc lidocaine injected easily. Completed without difficulty  Pain immediately resolved suggesting accurate placement of the medication.  Advised to call if fevers/chills, erythema, induration, drainage, or persistent bleeding.  Images permanently stored and available for review in the ultrasound unit.  Impression: Technically successful ultrasound guided injection.  Impression and Recommendations:

## 2014-01-23 NOTE — Assessment & Plan Note (Addendum)
Injections placed into the right third and fourth metacarpophalangeal joints Last injection was 7 months ago. Did have a vagal response to rapid injection into the metacarpophalangeal joint, he will take Ativan 2 mg prior to procedures.

## 2014-01-24 ENCOUNTER — Encounter: Payer: Self-pay | Admitting: Internal Medicine

## 2014-01-24 ENCOUNTER — Ambulatory Visit (INDEPENDENT_AMBULATORY_CARE_PROVIDER_SITE_OTHER): Admitting: Internal Medicine

## 2014-01-24 VITALS — BP 124/78 | HR 77 | Temp 98.3°F | Resp 18 | Wt 233.5 lb

## 2014-01-24 DIAGNOSIS — M549 Dorsalgia, unspecified: Secondary | ICD-10-CM

## 2014-01-24 DIAGNOSIS — M19049 Primary osteoarthritis, unspecified hand: Secondary | ICD-10-CM

## 2014-01-24 DIAGNOSIS — N400 Enlarged prostate without lower urinary tract symptoms: Secondary | ICD-10-CM

## 2014-01-24 DIAGNOSIS — N138 Other obstructive and reflux uropathy: Secondary | ICD-10-CM

## 2014-01-24 DIAGNOSIS — R339 Retention of urine, unspecified: Secondary | ICD-10-CM

## 2014-01-24 DIAGNOSIS — R338 Other retention of urine: Secondary | ICD-10-CM

## 2014-01-24 DIAGNOSIS — F411 Generalized anxiety disorder: Secondary | ICD-10-CM

## 2014-01-24 DIAGNOSIS — E663 Overweight: Secondary | ICD-10-CM

## 2014-01-24 DIAGNOSIS — Z79899 Other long term (current) drug therapy: Secondary | ICD-10-CM

## 2014-01-24 DIAGNOSIS — G8929 Other chronic pain: Secondary | ICD-10-CM

## 2014-01-24 DIAGNOSIS — F419 Anxiety disorder, unspecified: Secondary | ICD-10-CM

## 2014-01-24 DIAGNOSIS — N401 Enlarged prostate with lower urinary tract symptoms: Secondary | ICD-10-CM

## 2014-01-24 DIAGNOSIS — E785 Hyperlipidemia, unspecified: Secondary | ICD-10-CM

## 2014-01-24 LAB — COMPREHENSIVE METABOLIC PANEL
ALT: 25 U/L (ref 0–53)
AST: 22 U/L (ref 0–37)
Albumin: 4.2 g/dL (ref 3.5–5.2)
Alkaline Phosphatase: 58 U/L (ref 39–117)
BILIRUBIN TOTAL: 0.9 mg/dL (ref 0.3–1.2)
BUN: 19 mg/dL (ref 6–23)
CALCIUM: 9.2 mg/dL (ref 8.4–10.5)
CHLORIDE: 106 meq/L (ref 96–112)
CO2: 25 mEq/L (ref 19–32)
CREATININE: 0.7 mg/dL (ref 0.4–1.5)
GFR: 121.78 mL/min (ref >60.00)
Glucose, Bld: 111 mg/dL — ABNORMAL HIGH (ref 70–99)
Potassium: 3.9 mEq/L (ref 3.5–5.1)
Sodium: 139 mEq/L (ref 135–145)
Total Protein: 6.9 g/dL (ref 6.0–8.3)

## 2014-01-24 LAB — LIPID PANEL
Cholesterol: 210 mg/dL — ABNORMAL HIGH (ref 0–200)
HDL: 51.3 mg/dL (ref >39.00)
LDL Cholesterol: 147 mg/dL — ABNORMAL HIGH (ref 0–99)
TRIGLYCERIDES: 58 mg/dL (ref 0.0–149.0)
Total CHOL/HDL Ratio: 4
VLDL: 11.6 mg/dL (ref 0.0–40.0)

## 2014-01-24 MED ORDER — TAMSULOSIN HCL 0.4 MG PO CAPS: 0.4000 mg | ORAL_CAPSULE | Freq: Every day | ORAL | Status: DC

## 2014-01-24 MED ORDER — LORAZEPAM 1 MG PO TABS: ORAL_TABLET | ORAL | Status: DC

## 2014-01-24 MED ORDER — FINASTERIDE 5 MG PO TABS: 5.0000 mg | ORAL_TABLET | Freq: Every day | ORAL | Status: DC

## 2014-01-24 MED ORDER — TRAMADOL HCL 50 MG PO TABS: 50.0000 mg | ORAL_TABLET | Freq: Three times a day (TID) | ORAL | Status: DC | PRN

## 2014-01-24 NOTE — Progress Notes (Signed)
Pre-visit discussion using our clinic review tool. No additional management support is needed unless otherwise documented below in the visit note.  

## 2014-01-24 NOTE — Patient Instructions (Signed)
Your memory is fine,  Your stress and insomnia are the problem  Please consider starting an SSRI (citalopram) if the lorazepam doesn't help you rest better.  Please do not use the lorazepam on a nightly basis because it can be addicting.  Alternate between Tylenol PM

## 2014-01-24 NOTE — Progress Notes (Signed)
Patient ID: Jonathon Ralph., male   DOB: 08-21-1956, 58 y.o.   MRN: [id number]   Patient Active Problem List   Diagnosis Date Noted  . Overweight (BMI 25.0-29.9) 08/01/2013  . Hand arthritis 06/12/2013  . Pain of left heel 05/09/2013  . Bilateral shoulder pain 06/20/2012  . Cataract   . Right lumbar radiculitis 06/28/2011  . Anxiety disorder 05/30/2011  . Hyperlipidemia 05/29/2011  . Benign prostatic hypertrophy with urinary retention 05/29/2011  . Cataract of left eye 05/29/2011    Subjective:  CC:   Chief Complaint  Patient presents with  . Follow-up    6 month med refills    HPI:   Jonathon Roots. is a 58 y.o. male who presents for Follow up on chronic conditions including GAD, BPH, back pain with radiculopathy and hyperlipidemia and overweight .   GAD:  Increased stressors, hHas been up in [place] . taking care of Dad who had a large  right brain CVA.  Left sided paralysis improving.  Father Can't drive and not participating fully in in PT so he is providing  full time care. Not sleeping well, feels he is losing his memory and his hearing ,  Recently had his neurontin dose increased.  Needs refill on tramadol   Has gained 15 lbs due to inactivity.  Ran a 5k in the Fall with no problems    Past Medical History  Diagnosis Date  . Hyperlipidemia   . Anxiety   . Cataract     Past Surgical History  Procedure Laterality Date  . Spine surgery  1994, 2004    2 prior lumbar surgeries ([facility])  . Eye surgery  2001    LT, cataract, detached retina 2001,  buckle of sclera  [facility]  . Eye surgery  2004    RT, Cataract  . Eye surgery  2004     Lasik, LT       The following portions of the patient's history were reviewed and updated as appropriate: Allergies, current medications, and problem list.    Review of Systems:   Patient denies headache, fevers, malaise, unintentional weight loss, skin rash, eye pain, sinus congestion and sinus pain, sore throat,  dysphagia,  hemoptysis , cough, dyspnea, wheezing, chest pain, palpitations, orthopnea, edema, abdominal pain, nausea, melena, diarrhea, constipation, flank pain, dysuria, hematuria, urinary  Frequency, nocturia, numbness, tingling, seizures,  Focal weakness, Loss of consciousness,  Tremor,  depression,  and suicidal ideation.     History   Social History  . Marital Status: Married    Spouse Name: N/A    Number of Children: N/A  . Years of Education: N/A   Occupational History  . Not on file.   Social History Main Topics  . Smoking status: Never Smoker   . Smokeless tobacco: Never Used  . Alcohol Use: Yes     Comment: rare  . Drug Use: No  . Sexual Activity: Yes   Other Topics Concern  . Not on file   Social History Narrative  . No narrative on file    Objective:  Filed Vitals:   01/24/14 0819  BP: 124/78  Pulse: 77  Temp: 98.3 F (36.8 C)  Resp: 18     General appearance: alert, cooperative and appears stated age Ears: normal TM's and external ear canals both ears Throat: lips, mucosa, and tongue normal; teeth and gums normal Neck: no adenopathy, no carotid bruit, supple, symmetrical, trachea midline and thyroid not enlarged, symmetric, no  tenderness/mass/nodules Back: symmetric, no curvature. ROM normal. No CVA tenderness. Lungs: clear to auscultation bilaterally Heart: regular rate and rhythm, S1, S2 normal, no murmur, click, rub or gallop Abdomen: soft, non-tender; bowel sounds normal; no masses,  no organomegaly Pulses: 2+ and symmetric Skin: Skin color, texture, turgor normal. No rashes or lesions Lymph nodes: Cervical, supraclavicular, and axillary nodes normal.  Assessment and Plan:  Anxiety disorder Daytime symptoms are well-controlled with citalopram. He continues to have trouble with insomnia both with sleep initiation and occasional early morning wakening .  Discussed a trial of  low-dose lorazepam for sleep initiation or early morning wakening.  Risks and benefits of medication discussed.  MMSE exam was administered today due to concerns of memory loss and his score was 30/30    Hyperlipidemia New ACC guidelines recommend starting patients aged 15 or higher on moderate intensity statin therapy for LDL between 70-189 and 10 yr risk of CAD > 7.5% ;  His calculated risk is 11% .  Statin recommended   Lab Results  Component Value Date   CHOL 210* 01/24/2014   HDL 51.30 01/24/2014   LDLCALC 147* 01/24/2014   LDLDIRECT 169.2 07/31/2013   TRIG 58.0 01/24/2014   CHOLHDL 4 01/24/2014   Lab Results  Component Value Date   ALT 25 01/24/2014   AST 22 01/24/2014   ALKPHOS 58 01/24/2014   BILITOT 0.9 01/24/2014     Overweight (BMI 25.0-29.9) I have addressed  BMI and recommended a low glycemic index diet utilizing smaller more frequent meals to increase metabolism.  I have also recommended that patient start exercising with a goal of 30 minutes of aerobic exercise a minimum of 5 days per week.  Benign prostatic hypertrophy with urinary retention Managed by Dr. cope with tamsulosin. PSAs are being checked annually by Dr. cope.     Updated Medication List Outpatient Encounter Prescriptions as of 01/24/2014  Medication Sig  . acetaminophen (TYLENOL) 325 MG tablet Take 650 mg by mouth as needed.    . finasteride (PROSCAR) 5 MG tablet Take 1 tablet (5 mg total) by mouth daily.  Marland Kitchen gabapentin (NEURONTIN) 600 MG tablet Two tabs PO TID  . LORazepam (ATIVAN) 1 MG tablet One tablet as needed at bedtime for insomnia  . meloxicam (MOBIC) 15 MG tablet Take 1 tablet (15 mg total) by mouth daily.  . tamsulosin (FLOMAX) 0.4 MG CAPS capsule Take 1 capsule (0.4 mg total) by mouth daily.  . traMADol (ULTRAM) 50 MG tablet Take 1 tablet (50 mg total) by mouth every 8 (eight) hours as needed.  . [DISCONTINUED] finasteride (PROSCAR) 5 MG tablet Take 5 mg by mouth daily.  . [DISCONTINUED] LORazepam (ATIVAN) 2 MG tablet One tablet 2 hours before procedure, may  repeat if needed.  . [DISCONTINUED] tamsulosin (FLOMAX) 0.4 MG CAPS Take 1 capsule (0.4 mg total) by mouth daily.  . [DISCONTINUED] traMADol (ULTRAM) 50 MG tablet Take 1 tablet (50 mg total) by mouth every 8 (eight) hours as needed for pain.  . citalopram (CELEXA) 20 MG tablet Take 1 tablet (20 mg total) by mouth daily.     Orders Placed This Encounter  Procedures  . Lipid panel  . Comprehensive metabolic panel    No Follow-up on file.

## 2014-01-26 ENCOUNTER — Encounter: Payer: Self-pay | Admitting: Internal Medicine

## 2014-01-26 NOTE — Assessment & Plan Note (Addendum)
Daytime symptoms are well-controlled with citalopram. He continues to have trouble with insomnia both with sleep initiation and occasional early morning wakening .  Discussed a trial of  low-dose lorazepam for sleep initiation or early morning wakening. Risks and benefits of medication discussed.  MMSE exam was administered today due to concerns of memory loss and his score was 30/30

## 2014-01-26 NOTE — Assessment & Plan Note (Addendum)
New ACC guidelines recommend starting patients aged 57 or higher on moderate intensity statin therapy for LDL between 70-189 and 10 yr risk of CAD > 7.5% ;  His calculated risk is 11% .  Statin recommended   Lab Results  Component Value Date   CHOL 210* 01/24/2014   HDL 51.30 01/24/2014   LDLCALC 147* 01/24/2014   LDLDIRECT 169.2 07/31/2013   TRIG 58.0 01/24/2014   CHOLHDL 4 01/24/2014   Lab Results  Component Value Date   ALT 25 01/24/2014   AST 22 01/24/2014   ALKPHOS 58 01/24/2014   BILITOT 0.9 01/24/2014

## 2014-01-26 NOTE — Assessment & Plan Note (Signed)
Managed by Dr. cope with tamsulosin. PSAs are being checked annually by Dr. cope.

## 2014-01-26 NOTE — Assessment & Plan Note (Signed)
I have addressed  BMI and recommended a low glycemic index diet utilizing smaller more frequent meals to increase metabolism.  I have also recommended that patient start exercising with a goal of 30 minutes of aerobic exercise a minimum of 5 days per week.  

## 2014-04-15 ENCOUNTER — Other Ambulatory Visit: Payer: Self-pay | Admitting: Internal Medicine

## 2014-04-15 ENCOUNTER — Other Ambulatory Visit: Payer: Self-pay | Admitting: Sports Medicine

## 2014-04-25 ENCOUNTER — Ambulatory Visit (INDEPENDENT_AMBULATORY_CARE_PROVIDER_SITE_OTHER): Admitting: Sports Medicine

## 2014-04-25 ENCOUNTER — Encounter: Payer: Self-pay | Admitting: Sports Medicine

## 2014-04-25 VITALS — BP 108/62 | HR 80 | Ht 74.0 in | Wt 233.0 lb

## 2014-04-25 DIAGNOSIS — M25519 Pain in unspecified shoulder: Secondary | ICD-10-CM

## 2014-04-25 DIAGNOSIS — M549 Dorsalgia, unspecified: Secondary | ICD-10-CM

## 2014-04-25 DIAGNOSIS — G8929 Other chronic pain: Secondary | ICD-10-CM

## 2014-04-25 DIAGNOSIS — M19049 Primary osteoarthritis, unspecified hand: Secondary | ICD-10-CM

## 2014-04-25 DIAGNOSIS — M25511 Pain in right shoulder: Secondary | ICD-10-CM

## 2014-04-25 DIAGNOSIS — M25512 Pain in left shoulder: Principal | ICD-10-CM

## 2014-04-25 MED ORDER — GABAPENTIN 600 MG PO TABS: ORAL_TABLET | ORAL | Status: DC

## 2014-04-25 NOTE — Assessment & Plan Note (Signed)
Bilateral subacromial injection as above. Return in 3 months if needed.

## 2014-04-25 NOTE — Assessment & Plan Note (Signed)
Continues to do well, and needs a refill on gabapentin, currently doing 1200 mg 3 times a day.

## 2014-04-25 NOTE — Patient Instructions (Signed)
Check to see if extended-release gabapentin, "Gralise" is covered.

## 2014-04-25 NOTE — Assessment & Plan Note (Signed)
Continues to do well after right third and fourth metacarpal phalangeal joint injection 6 months ago.

## 2014-04-25 NOTE — Progress Notes (Signed)
  Subjective:    CC: Recheck shoulders  HPI: Bilateral subacromial bursitis: Jonathon Hill has done very well with every 3 month subacromial injections, he does have a distended bursa on each visit. Injections are going well. Desires repeat bilateral interventional treatment today.  Hand osteoarthritis:  Continues to be pain free after third and fourth metacarpal phalangeal joint injection 6 months ago.  Lumbar radiculitis: Doing well at 1200 mg of gabapentin 3 times a day, needs a refill.  Past medical history, Surgical history, Family history not pertinant except as noted below, Social history, Allergies, and medications have been entered into the medical record, reviewed, and no changes needed.   Review of Systems: No fevers, chills, night sweats, weight loss, chest pain, or shortness of breath.   Objective:    General: Well Developed, well nourished, and in no acute distress.  Neuro: Alert and oriented x3, extra-ocular muscles intact, sensation grossly intact.  HEENT: Normocephalic, atraumatic, pupils equal round reactive to light, neck supple, no masses, no lymphadenopathy, thyroid nonpalpable.  Skin: Warm and dry, no rashes. Cardiac: Regular rate and rhythm, no murmurs rubs or gallops, no lower extremity edema.  Respiratory: Clear to auscultation bilaterally. Not using accessory muscles, speaking in full sentences.  Procedure: Real-time Ultrasound Guided Injection of left subacromial bursa Device: GE Logiq E  Verbal informed consent obtained.  Time-out conducted.  Noted no overlying erythema, induration, or other signs of local infection.  Skin prepped in a sterile fashion.  Local anesthesia: Topical Ethyl chloride.  With sterile technique and under real time ultrasound guidance:  25-gauge needle advanced into the distended subacromial bursa, 1 cc kenalog 40, 3 cc lidocaine injected easily. Completed without difficulty  Pain immediately resolved suggesting accurate placement of the  medication.  Advised to call if fevers/chills, erythema, induration, drainage, or persistent bleeding.  Images permanently stored and available for review in the ultrasound unit.  Impression: Technically successful ultrasound guided injection  Procedure: Real-time Ultrasound Guided Injection of right subacromial bursa Device: GE Logiq E  Verbal informed consent obtained.  Time-out conducted.  Noted no overlying erythema, induration, or other signs of local infection.  Skin prepped in a sterile fashion.  Local anesthesia: Topical Ethyl chloride.  With sterile technique and under real time ultrasound guidance:  25-gauge needle advanced into the distended subacromial bursa, 1 cc kenalog 40, 3 cc lidocaine injected easily. Completed without difficulty  Pain immediately resolved suggesting accurate placement of the medication.  Advised to call if fevers/chills, erythema, induration, drainage, or persistent bleeding.  Images permanently stored and available for review in the ultrasound unit.  Impression: Technically successful ultrasound guided injection.  Impression and Recommendations:

## 2014-04-26 ENCOUNTER — Encounter: Payer: Self-pay | Admitting: Internal Medicine

## 2014-04-26 ENCOUNTER — Encounter: Payer: Self-pay | Admitting: *Deleted

## 2014-04-26 ENCOUNTER — Ambulatory Visit (INDEPENDENT_AMBULATORY_CARE_PROVIDER_SITE_OTHER): Admitting: Internal Medicine

## 2014-04-26 VITALS — BP 108/76 | HR 75 | Temp 98.4°F | Resp 18 | Ht 74.0 in | Wt 231.2 lb

## 2014-04-26 DIAGNOSIS — E663 Overweight: Secondary | ICD-10-CM

## 2014-04-26 DIAGNOSIS — E785 Hyperlipidemia, unspecified: Secondary | ICD-10-CM

## 2014-04-26 DIAGNOSIS — F411 Generalized anxiety disorder: Secondary | ICD-10-CM

## 2014-04-26 MED ORDER — MELOXICAM 15 MG PO TABS: ORAL_TABLET | ORAL | Status: DC

## 2014-04-26 NOTE — Progress Notes (Signed)
Patient ID: Jonathon Ralph., male   DOB: July 01, 1956, 58 y.o.   MRN: [id number]  Patient Active Problem List   Diagnosis Date Noted  . Overweight (BMI 25.0-29.9) 08/01/2013  . Hand arthritis 06/12/2013  . Pain of left heel 05/09/2013  . Bilateral shoulder pain 06/20/2012  . Cataract   . Right lumbar radiculitis 06/28/2011  . Anxiety disorder 05/30/2011  . Hyperlipidemia 05/29/2011  . Benign prostatic hypertrophy with urinary retention 05/29/2011  . Cataract of left eye 05/29/2011    Subjective:  CC:   Chief Complaint  Patient presents with  . Follow-up    3 month  . Anxiety    HPI:   Jonathon Acevedo. is a 58 y.o. male who presents for Follow up on anxiety.   Patient was reading a magazine when I entered the room and continued to flip through it for the first 5 minutes of his visit before finally  Putting it down and having eye contact. He is still caring for father who had a massive CVA in November 2014 with left sided neglect and contineus to live independently with considerable assistance from patient and other children.  .  Father has made tremendous progress with paralysis resolving  On left side  And is now walking and losing weight (50 lbs) gets frustrated very easily;  His mood swings are creating some conflict in patient's relationship .   Has some family support from brothers and sisters who do help.   Using lorazepam sporadically for insomnia .    Not exercising currently and has gained weight .  Plans to start doing exercise.   Still having sciatica on left with prolonged drivinng. Using gabapentin at nigfht    Past Medical History  Diagnosis Date  . Hyperlipidemia   . Anxiety   . Cataract     Past Surgical History  Procedure Laterality Date  . Spine surgery  1994, 2004    2 prior lumbar surgeries ([facility])  . Eye surgery  2001    LT, cataract, detached retina 2001,  buckle of sclera  [facility]  . Eye surgery  2004    RT, Cataract  . Eye surgery   2004     Lasik, LT       The following portions of the patient's history were reviewed and updated as appropriate: Allergies, current medications, and problem list.    Review of Systems:   Patient denies headache, fevers, malaise, unintentional weight loss, skin rash, eye pain, sinus congestion and sinus pain, sore throat, dysphagia,  hemoptysis , cough, dyspnea, wheezing, chest pain, palpitations, orthopnea, edema, abdominal pain, nausea, melena, diarrhea, constipation, flank pain, dysuria, hematuria, urinary  Frequency, nocturia, numbness, tingling, seizures,  Focal weakness, Loss of consciousness,  Tremor, insomnia, depression, anxiety, and suicidal ideation.     History   Social History  . Marital Status: Married    Spouse Name: N/A    Number of Children: N/A  . Years of Education: N/A   Occupational History  . Not on file.   Social History Main Topics  . Smoking status: Never Smoker   . Smokeless tobacco: Never Used  . Alcohol Use: Yes     Comment: rare  . Drug Use: No  . Sexual Activity: Yes   Other Topics Concern  . Not on file   Social History Narrative  . No narrative on file    Objective:  Filed Vitals:   04/26/14 0900  BP: 108/76  Pulse: 75  Temp: 98.4 F (36.9 C)  Resp: 18     General appearance: alert, cooperative and appears stated age Ears: normal TM's and external ear canals both ears Throat: lips, mucosa, and tongue normal; teeth and gums normal Neck: no adenopathy, no carotid bruit, supple, symmetrical, trachea midline and thyroid not enlarged, symmetric, no tenderness/mass/nodules Back: symmetric, no curvature. ROM normal. No CVA tenderness. Lungs: clear to auscultation bilaterally Heart: regular rate and rhythm, S1, S2 normal, no murmur, click, rub or gallop Abdomen: soft, non-tender; bowel sounds normal; no masses,  no organomegaly Pulses: 2+ and symmetric Skin: Skin color, texture, turgor normal. No rashes or lesions Lymph nodes:  Cervical, supraclavicular, and axillary nodes normal.  Assessment and Plan:  Overweight (BMI 25.0-29.9) He has gained 14 lbs since last July  Due to inactivity resulting from increased care giving responsibilities caring for his father who suffered a large CVA which affected his speech and ambulation.  He is clearly frustrated and feels that his wife is to blame because of her manipulative behavior .  Long discussion today about the marital dynamic, need to support each oterh as a team,  Offered counselling but he has declined  Anxiety disorder Daytime symptoms are well-controlled with citalopram. He continues to have trouble with insomnia both with sleep initiation and occasional early morning wakening .  He has been using  low-dose lorazepam for sleep initiation or early morning wakening.  Hyperlipidemia New ACC guidelines recommend starting patients aged 9 or higher on moderate intensity statin therapy for LDL between 70-189 and 10 yr risk of CAD > 7.5% ;  His calculated risk is 11% .  Statin was recommended at last visit  But patient deferred use.  Will repeat prior to next visit.   Lab Results  Component Value Date   CHOL 210* 01/24/2014   HDL 51.30 01/24/2014   LDLCALC 147* 01/24/2014   LDLDIRECT 169.2 07/31/2013   TRIG 58.0 01/24/2014   CHOLHDL 4 01/24/2014   Lab Results  Component Value Date   ALT 25 01/24/2014   AST 22 01/24/2014   ALKPHOS 58 01/24/2014   BILITOT 0.9 01/24/2014        Updated Medication List Outpatient Encounter Prescriptions as of 04/26/2014  Medication Sig  . acetaminophen (TYLENOL) 325 MG tablet Take 650 mg by mouth as needed.    . citalopram (CELEXA) 20 MG tablet TAKE 1 TABLET DAILY  . finasteride (PROSCAR) 5 MG tablet Take 1 tablet (5 mg total) by mouth daily.  Marland Kitchen gabapentin (NEURONTIN) 600 MG tablet Two tabs PO TID  . LORazepam (ATIVAN) 1 MG tablet One tablet as needed at bedtime for insomnia  . meloxicam (MOBIC) 15 MG tablet TAKE 1 TABLET DAILY  .  tamsulosin (FLOMAX) 0.4 MG CAPS capsule Take 1 capsule (0.4 mg total) by mouth daily.  . traMADol (ULTRAM) 50 MG tablet Take 1 tablet (50 mg total) by mouth every 8 (eight) hours as needed.  . [DISCONTINUED] meloxicam (MOBIC) 15 MG tablet TAKE 1 TABLET DAILY     No orders of the defined types were placed in this encounter.    No Follow-up on file.

## 2014-04-26 NOTE — Progress Notes (Signed)
Pre-visit discussion using our clinic review tool. No additional management support is needed unless otherwise documented below in the visit note.  

## 2014-04-26 NOTE — Patient Instructions (Signed)
I encourage you to resume your regular exercise program to help you manage your weight and your stress!  Try OTC Gas X or Phazyme for management of gas cuased by a healthy diet  You can also prevent it with using Beano before every meal

## 2014-04-27 ENCOUNTER — Ambulatory Visit: Admitting: Sports Medicine

## 2014-04-28 ENCOUNTER — Encounter: Payer: Self-pay | Admitting: Sports Medicine

## 2014-04-28 ENCOUNTER — Encounter: Payer: Self-pay | Admitting: Internal Medicine

## 2014-04-28 NOTE — Assessment & Plan Note (Signed)
Daytime symptoms are well-controlled with citalopram. He continues to have trouble with insomnia both with sleep initiation and occasional early morning wakening .  He has been using  low-dose lorazepam for sleep initiation or early morning wakening.

## 2014-04-28 NOTE — Assessment & Plan Note (Addendum)
New ACC guidelines recommend starting patients aged 57 or higher on moderate intensity statin therapy for LDL between 70-189 and 10 yr risk of CAD > 7.5% ;  His calculated risk is 11% .  Statin was recommended at last visit  But patient deferred use.  Will repeat prior to next visit.   Lab Results  Component Value Date   CHOL 210* 01/24/2014   HDL 51.30 01/24/2014   LDLCALC 147* 01/24/2014   LDLDIRECT 169.2 07/31/2013   TRIG 58.0 01/24/2014   CHOLHDL 4 01/24/2014   Lab Results  Component Value Date   ALT 25 01/24/2014   AST 22 01/24/2014   ALKPHOS 58 01/24/2014   BILITOT 0.9 01/24/2014

## 2014-04-28 NOTE — Assessment & Plan Note (Signed)
He has gained 14 lbs since last July  Due to inactivity resulting from increased care giving responsibilities caring for his father who suffered a large CVA which affected his speech and ambulation.  He is clearly frustrated and feels that his wife is to blame because of her manipulative behavior .  Long discussion today about the marital dynamic, need to support each oterh as a team,  Offered counselling but he has declined

## 2014-05-16 ENCOUNTER — Encounter: Payer: Self-pay | Admitting: Internal Medicine

## 2014-07-26 ENCOUNTER — Ambulatory Visit: Admitting: Sports Medicine

## 2014-07-27 ENCOUNTER — Encounter: Payer: Self-pay | Admitting: Internal Medicine

## 2014-07-27 ENCOUNTER — Ambulatory Visit (INDEPENDENT_AMBULATORY_CARE_PROVIDER_SITE_OTHER): Admitting: Internal Medicine

## 2014-07-27 ENCOUNTER — Ambulatory Visit (INDEPENDENT_AMBULATORY_CARE_PROVIDER_SITE_OTHER): Admitting: Sports Medicine

## 2014-07-27 ENCOUNTER — Encounter: Payer: Self-pay | Admitting: Sports Medicine

## 2014-07-27 VITALS — BP 116/78 | HR 70 | Temp 97.9°F | Resp 16 | Ht 74.0 in | Wt 236.0 lb

## 2014-07-27 VITALS — BP 119/72 | HR 77 | Wt 238.0 lb

## 2014-07-27 DIAGNOSIS — E663 Overweight: Secondary | ICD-10-CM

## 2014-07-27 DIAGNOSIS — M25512 Pain in left shoulder: Secondary | ICD-10-CM

## 2014-07-27 DIAGNOSIS — M129 Arthropathy, unspecified: Secondary | ICD-10-CM

## 2014-07-27 DIAGNOSIS — F411 Generalized anxiety disorder: Secondary | ICD-10-CM

## 2014-07-27 DIAGNOSIS — M549 Dorsalgia, unspecified: Secondary | ICD-10-CM

## 2014-07-27 DIAGNOSIS — E785 Hyperlipidemia, unspecified: Secondary | ICD-10-CM

## 2014-07-27 DIAGNOSIS — M19049 Primary osteoarthritis, unspecified hand: Secondary | ICD-10-CM

## 2014-07-27 DIAGNOSIS — M25511 Pain in right shoulder: Secondary | ICD-10-CM

## 2014-07-27 DIAGNOSIS — Z23 Encounter for immunization: Secondary | ICD-10-CM

## 2014-07-27 DIAGNOSIS — G8929 Other chronic pain: Secondary | ICD-10-CM

## 2014-07-27 DIAGNOSIS — R7301 Impaired fasting glucose: Secondary | ICD-10-CM

## 2014-07-27 LAB — COMPREHENSIVE METABOLIC PANEL
ALK PHOS: 59 U/L (ref 39–117)
ALT: 22 U/L (ref 0–53)
AST: 20 U/L (ref 0–37)
Albumin: 3.6 g/dL (ref 3.5–5.2)
BUN: 14 mg/dL (ref 6–23)
CO2: 24 mEq/L (ref 19–32)
Calcium: 8.8 mg/dL (ref 8.4–10.5)
Chloride: 106 mEq/L (ref 96–112)
Creatinine, Ser: 0.9 mg/dL (ref 0.4–1.5)
GFR: 93.66 mL/min (ref >60.00)
Glucose, Bld: 95 mg/dL (ref 70–99)
Potassium: 4.3 mEq/L (ref 3.5–5.1)
SODIUM: 140 meq/L (ref 135–145)
Total Bilirubin: 0.9 mg/dL (ref 0.2–1.2)
Total Protein: 6.8 g/dL (ref 6.0–8.3)

## 2014-07-27 LAB — LIPID PANEL
CHOL/HDL RATIO: 6
Cholesterol: 224 mg/dL — ABNORMAL HIGH (ref 0–200)
HDL: 40 mg/dL (ref >39.00)
LDL Cholesterol: 163 mg/dL — ABNORMAL HIGH (ref 0–99)
NonHDL: 184
Triglycerides: 106 mg/dL (ref 0.0–149.0)
VLDL: 21.2 mg/dL (ref 0.0–40.0)

## 2014-07-27 LAB — HEMOGLOBIN A1C: HEMOGLOBIN A1C: 5.7 % (ref 4.6–6.5)

## 2014-07-27 MED ORDER — CITALOPRAM HYDROBROMIDE 20 MG PO TABS: ORAL_TABLET | ORAL | Status: DC

## 2014-07-27 MED ORDER — LORAZEPAM 1 MG PO TABS: ORAL_TABLET | ORAL | Status: DC

## 2014-07-27 NOTE — Progress Notes (Signed)
  Subjective:    CC: Followup  HPI: Jonathon Hill returns, he is doing well regarding his shoulders.  Right hand osteoarthritis: He has had 2 injections in the last year, each one has lasted 6 months. He is ready for a repeat injection to the right third and fourth MCPs. Pain is moderate, persistent, without radiation.  Past medical history, Surgical history, Family history not pertinant except as noted below, Social history, Allergies, and medications have been entered into the medical record, reviewed, and no changes needed.   Review of Systems: No fevers, chills, night sweats, weight loss, chest pain, or shortness of breath.   Objective:    General: Well Developed, well nourished, and in no acute distress.  Neuro: Alert and oriented x3, extra-ocular muscles intact, sensation grossly intact.  HEENT: Normocephalic, atraumatic, pupils equal round reactive to light, neck supple, no masses, no lymphadenopathy, thyroid nonpalpable.  Skin: Warm and dry, no rashes. Cardiac: Regular rate and rhythm, no murmurs rubs or gallops, no lower extremity edema.  Respiratory: Clear to auscultation bilaterally. Not using accessory muscles, speaking in full sentences.  Procedure: Real-time Ultrasound Guided Injection of right third metacarpophalangeal joint Device: GE Logiq E  Verbal informed consent obtained.  Time-out conducted.  Noted no overlying erythema, induration, or other signs of local infection.  Skin prepped in a sterile fashion.  Local anesthesia: Topical Ethyl chloride.  With sterile technique and under real time ultrasound guidance:  30-gauge needle advanced into the joint, 0.5 cc kenalog 40, 0.5 cc lidocaine injected easily. Completed without difficulty  Pain immediately resolved suggesting accurate placement of the medication.  Advised to call if fevers/chills, erythema, induration, drainage, or persistent bleeding.  Images permanently stored and available for review in the ultrasound unit.    Impression: Technically successful ultrasound guided injection.  Procedure: Real-time Ultrasound Guided Injection of right fourth metacarpophalangeal joint Device: GE Logiq E  Verbal informed consent obtained.  Time-out conducted.  Noted no overlying erythema, induration, or other signs of local infection.  Skin prepped in a sterile fashion.  Local anesthesia: Topical Ethyl chloride.  With sterile technique and under real time ultrasound guidance:  30-gauge needle advanced into the joint, 0.5 cc kenalog 40, 0.5 cc lidocaine injected easily. Completed without difficulty  Pain immediately resolved suggesting accurate placement of the medication.  Advised to call if fevers/chills, erythema, induration, drainage, or persistent bleeding.  Images permanently stored and available for review in the ultrasound unit.  Impression: Technically successful ultrasound guided injection.  Impression and Recommendations:

## 2014-07-27 NOTE — Patient Instructions (Addendum)
Your last fasting glucose indicates you are at risk for developing diabetes, so I am checking an A1c today   I want you to lose 25 lbs over the next six months with a low glycemic index diet and regular exercise (30 minutes of cardio 5 days per week is your goal)  This is  my version of a  "Low GI"  Diet:  It will still lower your blood sugars and allow you to lose 4 to 8  lbs  per month if you follow it carefully.  Your goal with exercise is a minimum of 30 minutes of aerobic exercise 5 days per week (Walking does not count once it becomes easy!)     All of the foods can be found at grocery stores and in bulk at [employer].  The Atkins protein bars and shakes are available in more varieties at Target, WalMart and [employer]'s [employer].     7 AM Breakfast:  Choose from the following:  Low carbohydrate Protein  Shakes (I recommend the EAS AdvantEdge "Carb Control" shakes  Or the low carb shakes by Atkins.    2.5 carbs   Arnold's "Sandwhich Thin"toasted  w/ peanut butter (no jelly: about 20 net carbs  "Bagel Thin" with cream cheese and salmon: about 20 carbs   a scrambled egg/bacon/cheese burrito made with Mission's "carb balance" whole wheat tortilla  (about 10 net carbs )  A slice of home made fritatta (egg based dish without a crust:  google it)    Avoid cereal and bananas, oatmeal and cream of wheat and grits. They are loaded with carbohydrates!   10 AM: high protein snack  Protein bar by Atkins (the snack size, under 200 cal, usually < 6 net carbs).    A stick of cheese:  Around 1 carb,  100 cal     Dannon Light n Fit Greek Yogurt  (80 cal, 8 carbs)  Other so called "protein bars" and Greek yogurts tend to be loaded with carbohydrates.  Remember, in food advertising, the word "energy" is synonymous for " carbohydrate."  Lunch:   A Sandwich using the bread choices listed, Can use any  Eggs,  lunchmeat, grilled meat or canned tuna), avocado, regular mayo/mustard  and cheese.  A Salad using blue  cheese, ranch,  Goddess or vinagrette,  No croutons or "confetti" and no "candied nuts" but regular nuts OK.   No pretzels or chips.  Pickles and miniature sweet peppers are a good low carb alternative that provide a "crunch"  The bread is the only source of carbohydrate in a sandwich and  can be decreased by trying some of these alternatives to traditional loaf bread  Lexx's makes a pita bread and a flat bread that are 50 cal and 4 net carbs available at BJs and WalMart.  This can be toasted to use with hummous as well  Toufayan makes a low carb flatbread that's 100 cal and 9 net carbs available at [employer] and [employer]  Mission makes 2 sizes of  Low carb whole wheat tortilla  (The large one is 210 cal and 6 net carbs)  Flat Out makes flatbreads that are low carb as well  Avoid "Low fat dressings, as well as [place] and [place] dressings They are loaded with sugar!   3 PM/ Mid day  Snack:  Consider  1 ounce of  almonds, walnuts, pistachios, pecans, peanuts,  Macadamia nuts or a nut medley.  Avoid "granola"; the dried cranberries and   raisins are loaded with carbohydrates. Mixed nuts as long as there are no raisins,  cranberries or dried fruit.    Try the prosciutto/mozzarella cheese sticks by Fiorruci  In deli /backery section   High protein   To avoid overindulging in snacks: Try drinking a glass of unsweeted almond/coconut milk  Or a cup of coffee with your Atkins chocolate bar to keep you from having 3!!!   Pork rinds!  Yes Pork Rinds        6 PM  Dinner:     Meat/fowl/fish with a green salad, and either broccoli, cauliflower, green beans, spinach, brussel sprouts or  Lima beans. DO NOT BREAD THE PROTEIN!!      There is a low carb pasta by Dreamfield's that is acceptable and tastes great: only 5 digestible carbs/serving.( All grocery stores but BJs carry it )  Try Michel Angelo's chicken piccata or chicken or eggplant parm over low carb pasta.(Lowes and BJs)   Aaron Sanchez's  "Carnitas" (pulled pork, no sauce,  0 carbs) or his beef pot roast to make a dinner burrito (at BJ's)  Pesto over low carb pasta (bj's sells a good quality pesto in the center refrigerated section of the deli   Try satueeing  Bok Choy with mushroooms  Whole wheat pasta is still full of digestible carbs and  Not as low in glycemic index as Dreamfield's.   Brown rice is still rice,  So skip the rice and noodles if you eat Chinese or Thai (or at least limit to 1/2 cup)  9 PM snack :   Breyer's "low carb" fudgsicle or  ice cream bar (Carb Smart line), or  Weight Watcher's ice cream bar , or another "no sugar added" ice cream;  a serving of fresh berries/cherries with whipped cream   Cheese or DANNON'S LlGHT N FIT GREEK YOGURT or the Oikos greek yogurt   8 ounces of [place] unsweetened almond/cococunut milk  Cheese and crackers (using WASA crackers,  They are low carb) or peanut butter on low carb crackers or pita bread     Avoid bananas, pineapple, grapes  and watermelon on a regular basis because they are high in sugar.  THINK OF THEM AS DESSERT  Remember that snack Substitutions should be less than 10 NET carbs per serving and meals should be < 25 net carbs. Remember that carbohydrates from fiber do not affect blood sugar, so you can  subtract fiber grams to get the "net carbs " of any particular food item.   

## 2014-07-27 NOTE — Progress Notes (Signed)
Patient ID: Jonathon Ralph., male   DOB: 1955-10-19, 58 y.o.   MRN: [id number]  Patient Active Problem List   Diagnosis Date Noted  . Impaired fasting glucose 07/27/2014  . Overweight (BMI 25.0-29.9) 08/01/2013  . Hand arthritis 06/12/2013  . Pain of left heel 05/09/2013  . Bilateral shoulder pain 06/20/2012  . Cataract   . Right lumbar radiculitis 06/28/2011  . Anxiety disorder 05/30/2011  . Hyperlipidemia 05/29/2011  . Benign prostatic hypertrophy with urinary retention 05/29/2011  . Cataract of left eye 05/29/2011    Subjective:  CC:   Chief Complaint  Patient presents with  . Follow-up    3 month    HPI:   Jonathon Mccue. is a 58 y.o. male who presents for  Follow up on multiple chronic issues including GAD aggravated by caregiver issues and marital discord,,  Multiple orthopedic complaints, and overweight.  Sees Ortho for  and arthritis involving right hand 4th PIP and   bilateral shoulder pain,  Aggravated by firm mattress  .   Back pain  Early spinal stenosis managed with gapabentin .  His pain is improved  with walking.  Marital discord:  Aggravated by father's CVA requiring relocation of patient and wife to stay with father.  He and wife had a recent blowout  Things better now.  Acknowledges his lack of compassion aggravating things.  Father is now independent again and patient [place] on this week.   Insomnia managed with lorazepam .  No escalation of use.    Past Medical History  Diagnosis Date  . Hyperlipidemia   . Anxiety   . Cataract     Past Surgical History  Procedure Laterality Date  . Spine surgery  1994, 2004    2 prior lumbar surgeries ([facility])  . Eye surgery  2001    LT, cataract, detached retina 2001,  buckle of sclera  [facility]  . Eye surgery  2004    RT, Cataract  . Eye surgery  2004     Lasik, LT       The following portions of the patient's history were  reviewed and updated as appropriate: Allergies, current medications, and problem list.    Review of Systems:   Patient denies headache, fevers, malaise, unintentional weight loss, skin rash, eye pain, sinus congestion and sinus pain, sore throat, dysphagia,  hemoptysis , cough, dyspnea, wheezing, chest pain, palpitations, orthopnea, edema, abdominal pain, nausea, melena, diarrhea, constipation, flank pain, dysuria, hematuria, urinary  Frequency, nocturia, numbness, tingling, seizures,  Focal weakness, Loss of consciousness,  Tremor, insomnia, depression, anxiety, and suicidal ideation.     History   Social History  . Marital Status: Married    Spouse Name: N/A    Number of Children: N/A  . Years of Education: N/A   Occupational History  . Not on file.   Social History Main Topics  . Smoking status: Never Smoker   . Smokeless tobacco: Never Used  . Alcohol Use: Yes     Comment: rare  . Drug Use: No  . Sexual Activity: Yes   Other Topics Concern  . Not on file   Social History Narrative  . No narrative on file    Objective:  Filed Vitals:   07/27/14 0816  BP: 116/78  Pulse: 70  Temp: 97.9 F (36.6 C)  Resp: 16     General appearance: alert, cooperative and appears stated age Ears:  normal TM's and external ear canals both ears Throat: lips, mucosa, and tongue normal; teeth and gums normal Neck: no adenopathy, no carotid bruit, supple, symmetrical, trachea midline and thyroid not enlarged, symmetric, no tenderness/mass/nodules Back: symmetric, no curvature. ROM normal. No CVA tenderness. Lungs: clear to auscultation bilaterally Heart: regular rate and rhythm, S1, S2 normal, no murmur, click, rub or gallop Abdomen: soft, non-tender; bowel sounds normal; no masses,  no organomegaly Pulses: 2+ and symmetric Skin: Skin color, texture, turgor normal. No rashes or lesions Lymph nodes: Cervical, supraclavicular, and axillary nodes normal.  Assessment and  Plan:  Impaired fasting glucose I have addressed his IPG and increased BMI and recommended a low glycemic index diet utilizing smaller more frequent meals to increase metabolism.  I have also recommended that patient start exercising with a goal of 30 minutes of aerobic exercise a minimum of 5 days per week. Screening for lipid disorders, thyroid and diabetes to be done today.  Lab Results  Component Value Date   HGBA1C 5.7 07/27/2014         Overweight (BMI 25.0-29.9) Body mass index is 30.29 kg/(m^2).  Low GI diet discussed  and regular exercise strongly recommended,    Anxiety disorder Daytime symptoms are well-controlled with citalopram. He continues to have trouble with insomnia both with sleep initiation and occasional early morning wakening .  He has been using  low-dose lorazepam for sleep initiation or early morning wakening. Refills given    Hyperlipidemia New ACC guidelines recommend starting patients aged 3 or higher on moderate intensity statin therapy for LDL between 70-189 and 10 yr risk of CAD > 7.5,  His is is 13% using framingham risk   .  Statin was recommended at I again recommend  Starting a statin to reduce his risk for heart attack and stroke and have sent an rx for simvastatin,   Lab Results  Component Value Date   CHOL 224* 07/27/2014   HDL 40.00 07/27/2014   LDLCALC 163* 07/27/2014   LDLDIRECT 169.2 07/31/2013   TRIG 106.0 07/27/2014   CHOLHDL 6 07/27/2014   Lab Results  Component Value Date   ALT 22 07/27/2014   AST 20 07/27/2014   ALKPHOS 59 07/27/2014   BILITOT 0.9 07/27/2014        A total of 25  minutes of face to face time was spent with patient more than half of which was spent in counselling and coordination of care    Updated Medication List Outpatient Encounter Prescriptions as of 07/27/2014  Medication Sig  . acetaminophen (TYLENOL) 325 MG tablet Take 650 mg by mouth as needed.    . citalopram (CELEXA) 20 MG tablet TAKE 1  TABLET DAILY  . finasteride (PROSCAR) 5 MG tablet Take 1 tablet (5 mg total) by mouth daily.  Marland Kitchen gabapentin (NEURONTIN) 600 MG tablet Two tabs PO TID  . LORazepam (ATIVAN) 1 MG tablet One tablet as needed at bedtime for insomnia  . meloxicam (MOBIC) 15 MG tablet TAKE 1 TABLET DAILY  . tamsulosin (FLOMAX) 0.4 MG CAPS capsule Take 1 capsule (0.4 mg total) by mouth daily.  . traMADol (ULTRAM) 50 MG tablet Take 1 tablet (50 mg total) by mouth every 8 (eight) hours as needed.  . [DISCONTINUED] citalopram (CELEXA) 20 MG tablet TAKE 1 TABLET DAILY  . [DISCONTINUED] LORazepam (ATIVAN) 1 MG tablet One tablet as needed at bedtime for insomnia  . simvastatin (ZOCOR) 20 MG tablet Take 1 tablet (20 mg total) by mouth at bedtime.  Orders Placed This Encounter  Procedures  . Lipid panel  . Hemoglobin A1c  . Comprehensive metabolic panel    No Follow-up on file.

## 2014-07-27 NOTE — Progress Notes (Signed)
Pre-visit discussion using our clinic review tool. No additional management support is needed unless otherwise documented below in the visit note.  

## 2014-07-27 NOTE — Assessment & Plan Note (Signed)
Doing well, no intervention needed.

## 2014-07-27 NOTE — Assessment & Plan Note (Signed)
Repeat right third and fourth metacarpal phalangeal joint injections. Six-month response to the last injection.

## 2014-07-29 ENCOUNTER — Encounter: Payer: Self-pay | Admitting: Internal Medicine

## 2014-07-29 MED ORDER — SIMVASTATIN 20 MG PO TABS: 20.0000 mg | ORAL_TABLET | Freq: Every day | ORAL | Status: DC

## 2014-07-29 NOTE — Assessment & Plan Note (Signed)
Body mass index is 30.29 kg/(m^2).  Low GI diet discussed  and regular exercise strongly recommended,

## 2014-07-29 NOTE — Assessment & Plan Note (Signed)
I have addressed his IPG and increased BMI and recommended a low glycemic index diet utilizing smaller more frequent meals to increase metabolism.  I have also recommended that patient start exercising with a goal of 30 minutes of aerobic exercise a minimum of 5 days per week. Screening for lipid disorders, thyroid and diabetes to be done today.  Lab Results  Component Value Date   HGBA1C 5.7 07/27/2014

## 2014-07-29 NOTE — Assessment & Plan Note (Addendum)
New ACC guidelines recommend starting patients aged 57 or higher on moderate intensity statin therapy for LDL between 70-189 and 10 yr risk of CAD > 7.5,  His is is 13% using framingham risk   .  Statin was recommended at I again recommend  Starting a statin to reduce his risk for heart attack and stroke and have sent an rx for simvastatin,   Lab Results  Component Value Date   CHOL 224* 07/27/2014   HDL 40.00 07/27/2014   LDLCALC 163* 07/27/2014   LDLDIRECT 169.2 07/31/2013   TRIG 106.0 07/27/2014   CHOLHDL 6 07/27/2014   Lab Results  Component Value Date   ALT 22 07/27/2014   AST 20 07/27/2014   ALKPHOS 59 07/27/2014   BILITOT 0.9 07/27/2014

## 2014-07-29 NOTE — Assessment & Plan Note (Signed)
Daytime symptoms are well-controlled with citalopram. He continues to have trouble with insomnia both with sleep initiation and occasional early morning wakening .  He has been using  low-dose lorazepam for sleep initiation or early morning wakening. Refills given

## 2014-10-19 ENCOUNTER — Encounter: Payer: Self-pay | Admitting: Sports Medicine

## 2014-10-19 DIAGNOSIS — M25519 Pain in unspecified shoulder: Secondary | ICD-10-CM

## 2015-01-16 ENCOUNTER — Ambulatory Visit (INDEPENDENT_AMBULATORY_CARE_PROVIDER_SITE_OTHER): Admitting: Sports Medicine

## 2015-01-16 VITALS — BP 99/62 | HR 76 | Wt 209.0 lb

## 2015-01-16 DIAGNOSIS — M25511 Pain in right shoulder: Secondary | ICD-10-CM

## 2015-01-16 DIAGNOSIS — M129 Arthropathy, unspecified: Secondary | ICD-10-CM | POA: Diagnosis not present

## 2015-01-16 DIAGNOSIS — M25512 Pain in left shoulder: Secondary | ICD-10-CM | POA: Diagnosis not present

## 2015-01-16 DIAGNOSIS — M19049 Primary osteoarthritis, unspecified hand: Secondary | ICD-10-CM

## 2015-01-16 MED ORDER — LORAZEPAM 2 MG PO TABS: ORAL_TABLET | ORAL | Status: DC

## 2015-01-16 NOTE — Progress Notes (Signed)
Subjective:    CC: Follow-up   HPI: Right hand osteoarthritis: Is done extremely well with an eight-month response to right third and fourth metacarpophalangeal joints under guidance, now having a recurrence of pain and desires a repeat.  Bilateral subacromial bursitis: Has done very well, 8 month response as well to bilateral ultrasound-guided subacromial injections, again desires a repeat. Pain is moderate, persistent.  Past medical history, Surgical history, Family history not pertinant except as noted below, Social history, Allergies, and medications have been entered into the medical record, reviewed, and no changes needed.   Review of Systems: No fevers, chills, night sweats, weight loss, chest pain, or shortness of breath.   Objective:    General: Well Developed, well nourished, and in no acute distress.  Neuro: Alert and oriented x3, extra-ocular muscles intact, sensation grossly intact.  HEENT: Normocephalic, atraumatic, pupils equal round reactive to light, neck supple, no masses, no lymphadenopathy, thyroid nonpalpable.  Skin: Warm and dry, no rashes. Cardiac: Regular rate and rhythm, no murmurs rubs or gallops, no lower extremity edema.  Respiratory: Clear to auscultation bilaterally. Not using accessory muscles, speaking in full sentences.  Procedure: Real-time Ultrasound Guided Injection of right subacromial bursa Device: GE Logiq E  Verbal informed consent obtained.  Time-out conducted.  Noted no overlying erythema, induration, or other signs of local infection.  Skin prepped in a sterile fashion.  Local anesthesia: Topical Ethyl chloride.  With sterile technique and under real time ultrasound guidance:  Noted distended subacromial bursa overlying an intact supraspinatus, 25-gauge needle advanced into the bursa and 1 mL kenalog 40, 3 mL lidocaine injected easily. Completed without difficulty  Pain immediately resolved suggesting accurate placement of the medication.    Advised to call if fevers/chills, erythema, induration, drainage, or persistent bleeding.  Images permanently stored and available for review in the ultrasound unit.  Impression: Technically successful ultrasound guided injection.  Procedure: Real-time Ultrasound Guided Injection of left subacromial bursa Device: GE Logiq E  Verbal informed consent obtained.  Time-out conducted.  Noted no overlying erythema, induration, or other signs of local infection.  Skin prepped in a sterile fashion.  Local anesthesia: Topical Ethyl chloride.  With sterile technique and under real time ultrasound guidance:  Noted distended subacromial bursa overlying an intact supraspinatus, 25-gauge needle advanced into the bursa and 1 mL kenalog 40, 3 mL lidocaine injected easily. Completed without difficulty  Pain immediately resolved suggesting accurate placement of the medication.  Advised to call if fevers/chills, erythema, induration, drainage, or persistent bleeding.  Images permanently stored and available for review in the ultrasound unit.  Impression: Technically successful ultrasound guided injection.  Procedure: Real-time Ultrasound Guided Injection of right third metacarpal phalangeal joint Device: GE Logiq E  Verbal informed consent obtained.  Time-out conducted.  Noted no overlying erythema, induration, or other signs of local infection.  Skin prepped in a sterile fashion.  Local anesthesia: Topical Ethyl chloride.  With sterile technique and under real time ultrasound guidance:  I advanced a 25-gauge needle over the dorsum of the metacarpal, under the extensor digitorum longus tendon into the metacarpophalangeal joint, 0.5 mL Kenalog 40, 0.5 mL lidocaine injected easily. Completed without difficulty  Pain immediately resolved suggesting accurate placement of the medication.  Advised to call if fevers/chills, erythema, induration, drainage, or persistent bleeding.  Images permanently stored and  available for review in the ultrasound unit.  Impression: Technically successful ultrasound guided injection.  Procedure: Real-time Ultrasound Guided Injection of right fourth metacarpal phalangeal joint Device: GE Logiq E  Verbal informed consent obtained.  Time-out conducted.  Noted no overlying erythema, induration, or other signs of local infection.  Skin prepped in a sterile fashion.  Local anesthesia: Topical Ethyl chloride.  With sterile technique and under real time ultrasound guidance:  I advanced a 25-gauge needle over the dorsum of the metacarpal, under the extensor digitorum longus tendon into the metacarpophalangeal joint, 0.5 mL Kenalog 40, 0.5 mL lidocaine injected easily. Completed without difficulty  Pain immediately resolved suggesting accurate placement of the medication.  Advised to call if fevers/chills, erythema, induration, drainage, or persistent bleeding.  Images permanently stored and available for review in the ultrasound unit.  Impression: Technically successful ultrasound guided injection.  At this point the patient had a vagal response, and recovered quickly.  Impression and Recommendations:

## 2015-01-16 NOTE — Assessment & Plan Note (Addendum)
Right third and fourth metacarpal phalangeal joints injections. Vasovagal response during injection. 5 mg of Ativan prior to future injections.  We will do 4 mg of Ativan 2 hours prior to subsequent injections. Prescription written.

## 2015-01-16 NOTE — Assessment & Plan Note (Signed)
Previous injection was over 8 months ago. Bilateral subacromial injection as above. Return as needed.

## 2015-01-24 ENCOUNTER — Ambulatory Visit (INDEPENDENT_AMBULATORY_CARE_PROVIDER_SITE_OTHER): Admitting: Internal Medicine

## 2015-01-24 ENCOUNTER — Encounter: Payer: Self-pay | Admitting: Internal Medicine

## 2015-01-24 VITALS — BP 118/78 | HR 61 | Temp 98.0°F | Resp 14 | Ht 74.0 in | Wt 196.5 lb

## 2015-01-24 DIAGNOSIS — N400 Enlarged prostate without lower urinary tract symptoms: Secondary | ICD-10-CM | POA: Diagnosis not present

## 2015-01-24 DIAGNOSIS — R7301 Impaired fasting glucose: Secondary | ICD-10-CM

## 2015-01-24 DIAGNOSIS — E785 Hyperlipidemia, unspecified: Secondary | ICD-10-CM

## 2015-01-24 DIAGNOSIS — E559 Vitamin D deficiency, unspecified: Secondary | ICD-10-CM

## 2015-01-24 DIAGNOSIS — E663 Overweight: Secondary | ICD-10-CM

## 2015-01-24 DIAGNOSIS — Z125 Encounter for screening for malignant neoplasm of prostate: Secondary | ICD-10-CM | POA: Diagnosis not present

## 2015-01-24 DIAGNOSIS — M129 Arthropathy, unspecified: Secondary | ICD-10-CM | POA: Diagnosis not present

## 2015-01-24 DIAGNOSIS — Z79899 Other long term (current) drug therapy: Secondary | ICD-10-CM

## 2015-01-24 DIAGNOSIS — M19049 Primary osteoarthritis, unspecified hand: Secondary | ICD-10-CM

## 2015-01-24 LAB — LIPID PANEL
CHOL/HDL RATIO: 3
Cholesterol: 149 mg/dL (ref 0–200)
HDL: 58.5 mg/dL (ref >39.00)
LDL Cholesterol: 82 mg/dL (ref 0–99)
NONHDL: 90.5
Triglycerides: 44 mg/dL (ref 0.0–149.0)
VLDL: 8.8 mg/dL (ref 0.0–40.0)

## 2015-01-24 LAB — COMPREHENSIVE METABOLIC PANEL
ALK PHOS: 61 U/L (ref 39–117)
ALT: 47 U/L (ref 0–53)
AST: 45 U/L — ABNORMAL HIGH (ref 0–37)
Albumin: 4.4 g/dL (ref 3.5–5.2)
BILIRUBIN TOTAL: 1.2 mg/dL (ref 0.2–1.2)
BUN: 17 mg/dL (ref 6–23)
CO2: 31 meq/L (ref 19–32)
CREATININE: 0.84 mg/dL (ref 0.40–1.50)
Calcium: 9.3 mg/dL (ref 8.4–10.5)
Chloride: 103 mEq/L (ref 96–112)
GFR: 99.94 mL/min (ref >60.00)
GLUCOSE: 99 mg/dL (ref 70–99)
Potassium: 4.1 mEq/L (ref 3.5–5.1)
SODIUM: 139 meq/L (ref 135–145)
Total Protein: 6.8 g/dL (ref 6.0–8.3)

## 2015-01-24 LAB — VITAMIN D 25 HYDROXY (VIT D DEFICIENCY, FRACTURES): VITD: 39.65 ng/mL (ref 30.00–100.00)

## 2015-01-24 LAB — PSA: PSA: 0.12 ng/mL (ref 0.10–4.00)

## 2015-01-24 MED ORDER — SIMVASTATIN 20 MG PO TABS: 20.0000 mg | ORAL_TABLET | Freq: Every day | ORAL | Status: DC

## 2015-01-24 MED ORDER — TAMSULOSIN HCL 0.4 MG PO CAPS: 0.4000 mg | ORAL_CAPSULE | Freq: Every day | ORAL | Status: DC

## 2015-01-24 MED ORDER — CITALOPRAM HYDROBROMIDE 20 MG PO TABS: ORAL_TABLET | ORAL | Status: DC

## 2015-01-24 MED ORDER — FINASTERIDE 5 MG PO TABS: 5.0000 mg | ORAL_TABLET | Freq: Every day | ORAL | Status: DC

## 2015-01-24 MED ORDER — LORAZEPAM 2 MG PO TABS: ORAL_TABLET | ORAL | Status: DC

## 2015-01-24 NOTE — Progress Notes (Signed)
Patient ID: Jonathon Hill., male   DOB: 11-05-1955, 59 y.o.   MRN: [id number]   Patient Active Problem List   Diagnosis Date Noted  . Impaired fasting glucose 07/27/2014  . Overweight (BMI 25.0-29.9) 08/01/2013  . Hand arthritis 06/12/2013  . Pain of left heel 05/09/2013  . Bilateral shoulder pain 06/20/2012  . Cataract   . Right lumbar radiculitis 06/28/2011  . Anxiety disorder 05/30/2011  . Hyperlipidemia 05/29/2011  . Benign prostatic hypertrophy with urinary retention 05/29/2011  . Cataract of left eye 05/29/2011    Subjective:  CC:   Chief Complaint  Patient presents with  . Follow-up    patient fasting,     HPI:   Jonathon Hill. is a 59 y.o. male who presents for 6 month follow up on multiple conditions including overweight, depression and hyperlipidemia.  Feels good,  Has lost most of  His excess  weight.  Goal is 185 lbs.    Last weight in this office 238 lbs in October ,  Now 197 lbs,  Walking daily .  Getting steroid injections for joint pain .   BP 118/78 mmHg  Pulse 61  Temp(Src) 98 F (36.7 C) (Oral)  Resp 14  Ht 6\' 2"  (1.88 m)  Wt 196 lb 8 oz (89.132 kg)  BMI 25.22 kg/m2  SpO2 96%   Tolerating medications without side effects and taking htem regularly,  Hoping he can come off the statin and BP medication if he reaches his goal weight   Spent most of visit discussing marital issues.   Wife has OCD and does not edit or compartmentalize her  issues,  He is very frustrated at their relationship,  feels she does not respect his time and frequently interrupts his activities to serve her own purposes and irrational fears.  He balmes her for his frequent train of thought  No eye exam in over a year,  Bilateral lens implants,  Lot scleral buckle on the left. 20/20 correct on right,  20/15 on left   Dr Jacqlyn Larsen annual GU exam, checks PSA   Past Medical History  Diagnosis Date  . Hyperlipidemia   . Anxiety   . Cataract     Past Surgical History   Procedure Laterality Date  . Spine surgery  1994, 2004    2 prior lumbar surgeries ([facility])  . Eye surgery  2001    LT, cataract, detached retina 2001,  buckle of sclera  [facility]  . Eye surgery  2004    RT, Cataract  . Eye surgery  2004     Lasik, LT       The following portions of the patient's history were reviewed and updated as appropriate: Allergies, current medications, and problem list.    Review of Systems:   Patient denies headache, fevers, malaise, unintentional weight loss, skin rash, eye pain, sinus congestion and sinus pain, sore throat, dysphagia,  hemoptysis , cough, dyspnea, wheezing, chest pain, palpitations, orthopnea, edema, abdominal pain, nausea, melena, diarrhea, constipation, flank pain, dysuria, hematuria, urinary  Frequency, nocturia, numbness, tingling, seizures,  Focal weakness, Loss of consciousness,  Tremor, insomnia, depression, anxiety, and suicidal ideation.     History   Social History  . Marital Status: Married    Spouse Name: N/A  . Number of Children: N/A  . Years of Education: N/A   Occupational History  . Not on file.   Social History Main Topics  . Smoking status: Never Smoker   . Smokeless  tobacco: Never Used  . Alcohol Use: Yes     Comment: rare  . Drug Use: No  . Sexual Activity: Yes   Other Topics Concern  . Not on file   Social History Narrative    Objective:  Filed Vitals:   01/24/15 0806  BP: 118/78  Pulse: 61  Temp: 98 F (36.7 C)  Resp: 14     General appearance: alert, cooperative and appears stated age Ears: normal TM's and external ear canals both ears Throat: lips, mucosa, and tongue normal; teeth and gums normal Neck: no adenopathy, no carotid bruit, supple, symmetrical, trachea midline and thyroid not enlarged, symmetric, no tenderness/mass/nodules Back: symmetric, no curvature. ROM normal. No CVA tenderness. Lungs: clear to auscultation bilaterally Heart: regular rate and rhythm, S1, S2  normal, no murmur, click, rub or gallop Abdomen: soft, non-tender; bowel sounds normal; no masses,  no organomegaly Pulses: 2+ and symmetric Skin: Skin color, texture, turgor normal. No rashes or lesions Lymph nodes: Cervical, supraclavicular, and axillary nodes normal.  Assessment and Plan:  Impaired fasting glucose addressed  With  Low GI diet and regular exercise a minimum of 5 days per week.   Lab Results  Component Value Date   HGBA1C 5.7 07/27/2014      Hyperlipidemia Managed with  statin to reduce his risk for heart attack and stroke Well controlled on current statin therapy.   Liver enzymes are normal , no changes today.  Lab Results  Component Value Date   CHOL 149 01/24/2015   HDL 58.50 01/24/2015   LDLCALC 82 01/24/2015   LDLDIRECT 169.2 07/31/2013   TRIG 44.0 01/24/2015   CHOLHDL 3 01/24/2015   Lab Results  Component Value Date   ALT 47 01/24/2015   AST 45* 01/24/2015   ALKPHOS 61 01/24/2015   BILITOT 1.2 01/24/2015            Overweight (BMI 25.0-29.9) I have congratulated him in reduction of   BMI and encouraged  Continued  low glycemic index diet and regular exercise a minimum of 5 days per week.  Body mass index is 25.22 kg/(m^2). Wt Readings from Last 3 Encounters:  01/24/15 196 lb 8 oz (89.132 kg)  01/16/15 209 lb (94.802 kg)  07/27/14 238 lb (107.956 kg)       Updated Medication List Outpatient Encounter Prescriptions as of 01/24/2015  Medication Sig  . acetaminophen (TYLENOL) 325 MG tablet Take 650 mg by mouth as needed.    . citalopram (CELEXA) 20 MG tablet TAKE 1 TABLET DAILY  . finasteride (PROSCAR) 5 MG tablet Take 1 tablet (5 mg total) by mouth daily.  Marland Kitchen gabapentin (NEURONTIN) 600 MG tablet Two tabs PO TID  . LORazepam (ATIVAN) 2 MG tablet 2mg  as needed at bedtime for insomnia, and 4mg  2 hours prior to procedures.  . meloxicam (MOBIC) 15 MG tablet TAKE 1 TABLET DAILY  . simvastatin (ZOCOR) 20 MG tablet Take 1 tablet (20 mg  total) by mouth at bedtime.  . tamsulosin (FLOMAX) 0.4 MG CAPS capsule Take 1 capsule (0.4 mg total) by mouth daily.  . traMADol (ULTRAM) 50 MG tablet Take 1 tablet (50 mg total) by mouth every 8 (eight) hours as needed.  . [DISCONTINUED] citalopram (CELEXA) 20 MG tablet TAKE 1 TABLET DAILY  . [DISCONTINUED] finasteride (PROSCAR) 5 MG tablet Take 1 tablet (5 mg total) by mouth daily.  . [DISCONTINUED] LORazepam (ATIVAN) 2 MG tablet 2mg  as needed at bedtime for insomnia, and 4mg  2 hours prior to procedures.  . [  DISCONTINUED] simvastatin (ZOCOR) 20 MG tablet Take 1 tablet (20 mg total) by mouth at bedtime.  . [DISCONTINUED] tamsulosin (FLOMAX) 0.4 MG CAPS capsule Take 1 capsule (0.4 mg total) by mouth daily.     Orders Placed This Encounter  Procedures  . Comprehensive metabolic panel  . Lipid panel  . Vit D  25 hydroxy (rtn osteoporosis monitoring)  . PSA    Return in about 6 months (around 07/26/2015).

## 2015-01-24 NOTE — Patient Instructions (Signed)
You are doin g well!  Im checking cholesterol and PSA today,    If cholesterol is low we'll take a break from the zocor

## 2015-01-26 ENCOUNTER — Encounter: Payer: Self-pay | Admitting: Internal Medicine

## 2015-01-26 NOTE — Assessment & Plan Note (Signed)
Managed with  statin to reduce his risk for heart attack and stroke Well controlled on current statin therapy.   Liver enzymes are normal , no changes today.  Lab Results  Component Value Date   CHOL 149 01/24/2015   HDL 58.50 01/24/2015   LDLCALC 82 01/24/2015   LDLDIRECT 169.2 07/31/2013   TRIG 44.0 01/24/2015   CHOLHDL 3 01/24/2015   Lab Results  Component Value Date   ALT 47 01/24/2015   AST 45* 01/24/2015   ALKPHOS 61 01/24/2015   BILITOT 1.2 01/24/2015

## 2015-01-26 NOTE — Assessment & Plan Note (Signed)
I have congratulated him in reduction of   BMI and encouraged  Continued  low glycemic index diet and regular exercise a minimum of 5 days per week.  Body mass index is 25.22 kg/(m^2). Wt Readings from Last 3 Encounters:  01/24/15 196 lb 8 oz (89.132 kg)  01/16/15 209 lb (94.802 kg)  07/27/14 238 lb (107.956 kg)

## 2015-01-26 NOTE — Assessment & Plan Note (Signed)
addressed  With  Low GI diet and regular exercise a minimum of 5 days per week.   Lab Results  Component Value Date   HGBA1C 5.7 07/27/2014

## 2015-01-27 ENCOUNTER — Encounter: Payer: Self-pay | Admitting: Internal Medicine

## 2015-01-27 ENCOUNTER — Other Ambulatory Visit: Payer: Self-pay | Admitting: Internal Medicine

## 2015-01-27 DIAGNOSIS — R748 Abnormal levels of other serum enzymes: Secondary | ICD-10-CM | POA: Insufficient documentation

## 2015-01-27 DIAGNOSIS — E785 Hyperlipidemia, unspecified: Secondary | ICD-10-CM

## 2015-02-22 ENCOUNTER — Other Ambulatory Visit: Payer: Self-pay | Admitting: Sports Medicine

## 2015-05-09 ENCOUNTER — Other Ambulatory Visit: Payer: Self-pay | Admitting: Internal Medicine

## 2015-08-05 ENCOUNTER — Ambulatory Visit (INDEPENDENT_AMBULATORY_CARE_PROVIDER_SITE_OTHER): Admitting: Sports Medicine

## 2015-08-05 ENCOUNTER — Encounter: Payer: Self-pay | Admitting: Sports Medicine

## 2015-08-05 VITALS — BP 106/69 | HR 64 | Wt 188.0 lb

## 2015-08-05 DIAGNOSIS — M129 Arthropathy, unspecified: Secondary | ICD-10-CM

## 2015-08-05 DIAGNOSIS — M25512 Pain in left shoulder: Secondary | ICD-10-CM

## 2015-08-05 DIAGNOSIS — M25511 Pain in right shoulder: Secondary | ICD-10-CM

## 2015-08-05 DIAGNOSIS — M19049 Primary osteoarthritis, unspecified hand: Secondary | ICD-10-CM

## 2015-08-05 NOTE — Assessment & Plan Note (Signed)
Repeat bilateral subacromial injection, 5-6 months response after the previous

## 2015-08-05 NOTE — Progress Notes (Signed)
Subjective:    CC: Follow-up   HPI: Right hand osteoarthritis: Is done extremely well with an 31-month response to right third and fourth metacarpophalangeal joints under guidance, now having a recurrence of pain and desires a repeat.  Bilateral subacromial bursitis: Has done very well, 6 month response as well to bilateral ultrasound-guided subacromial injections, again desires a repeat. Pain is moderate, persistent.  Past medical history, Surgical history, Family history not pertinant except as noted below, Social history, Allergies, and medications have been entered into the medical record, reviewed, and no changes needed.   Review of Systems: No fevers, chills, night sweats, weight loss, chest pain, or shortness of breath.   Objective:    General: Well Developed, well nourished, and in no acute distress.  Neuro: Alert and oriented x3, extra-ocular muscles intact, sensation grossly intact.  HEENT: Normocephalic, atraumatic, pupils equal round reactive to light, neck supple, no masses, no lymphadenopathy, thyroid nonpalpable.  Skin: Warm and dry, no rashes. Cardiac: Regular rate and rhythm, no murmurs rubs or gallops, no lower extremity edema.  Respiratory: Clear to auscultation bilaterally. Not using accessory muscles, speaking in full sentences.  Procedure: Real-time Ultrasound Guided Injection of right subacromial bursa Device: GE Logiq E  Verbal informed consent obtained.  Time-out conducted.  Noted no overlying erythema, induration, or other signs of local infection.  Skin prepped in a sterile fashion.  Local anesthesia: Topical Ethyl chloride.  With sterile technique and under real time ultrasound guidance:  Noted distended subacromial bursa overlying an intact supraspinatus, 25-gauge needle advanced into the bursa and 1 mL kenalog 40, 3 mL lidocaine injected easily. Completed without difficulty  Pain immediately resolved suggesting accurate placement of the medication.    Advised to call if fevers/chills, erythema, induration, drainage, or persistent bleeding.  Images permanently stored and available for review in the ultrasound unit.  Impression: Technically successful ultrasound guided injection.  Procedure: Real-time Ultrasound Guided Injection of left subacromial bursa Device: GE Logiq E  Verbal informed consent obtained.  Time-out conducted.  Noted no overlying erythema, induration, or other signs of local infection.  Skin prepped in a sterile fashion.  Local anesthesia: Topical Ethyl chloride.  With sterile technique and under real time ultrasound guidance:  Noted distended subacromial bursa overlying an intact supraspinatus, 25-gauge needle advanced into the bursa and 1 mL kenalog 40, 3 mL lidocaine injected easily. Completed without difficulty  Pain immediately resolved suggesting accurate placement of the medication.  Advised to call if fevers/chills, erythema, induration, drainage, or persistent bleeding.  Images permanently stored and available for review in the ultrasound unit.  Impression: Technically successful ultrasound guided injection.  Procedure: Real-time Ultrasound Guided Injection of right third metacarpal phalangeal joint Device: GE Logiq E  Verbal informed consent obtained.  Time-out conducted.  Noted no overlying erythema, induration, or other signs of local infection.  Skin prepped in a sterile fashion.  Local anesthesia: Topical Ethyl chloride.  With sterile technique and under real time ultrasound guidance:  I advanced a 25-gauge needle over the dorsum of the metacarpal, under the extensor digitorum longus tendon into the metacarpophalangeal joint, 0.5 mL Kenalog 40, 0.5 mL lidocaine injected easily. Completed without difficulty  Pain immediately resolved suggesting accurate placement of the medication.  Advised to call if fevers/chills, erythema, induration, drainage, or persistent bleeding.  Images permanently stored and  available for review in the ultrasound unit.  Impression: Technically successful ultrasound guided injection.  Procedure: Real-time Ultrasound Guided Injection of right fourth metacarpal phalangeal joint Device: GE Logiq E  Verbal informed consent obtained.  Time-out conducted.  Noted no overlying erythema, induration, or other signs of local infection.  Skin prepped in a sterile fashion.  Local anesthesia: Topical Ethyl chloride.  With sterile technique and under real time ultrasound guidance:  I advanced a 25-gauge needle over the dorsum of the metacarpal, under the extensor digitorum longus tendon into the metacarpophalangeal joint, 0.5 mL Kenalog 40, 0.5 mL lidocaine injected easily. Completed without difficulty  Pain immediately resolved suggesting accurate placement of the medication.  Advised to call if fevers/chills, erythema, induration, drainage, or persistent bleeding.  Images permanently stored and available for review in the ultrasound unit.  Impression: Technically successful ultrasound guided injection.  No vagal response today  Impression and Recommendations:

## 2015-08-05 NOTE — Assessment & Plan Note (Signed)
Repeat right third and fourth MCP injections, previous injections provided 5-6 months of relief.

## 2015-08-07 ENCOUNTER — Ambulatory Visit: Admitting: Sports Medicine

## 2015-08-08 ENCOUNTER — Ambulatory Visit (INDEPENDENT_AMBULATORY_CARE_PROVIDER_SITE_OTHER)
Admission: RE | Admit: 2015-08-08 | Discharge: 2015-08-08 | Disposition: A | Discharge status: Home / Self Care | Source: Ambulatory Visit | Attending: Internal Medicine | Admitting: Internal Medicine

## 2015-08-08 ENCOUNTER — Encounter: Payer: Self-pay | Admitting: Internal Medicine

## 2015-08-08 ENCOUNTER — Ambulatory Visit (INDEPENDENT_AMBULATORY_CARE_PROVIDER_SITE_OTHER): Admitting: Internal Medicine

## 2015-08-08 VITALS — BP 120/78 | HR 67 | Temp 98.5°F | Wt 186.2 lb

## 2015-08-08 DIAGNOSIS — R748 Abnormal levels of other serum enzymes: Secondary | ICD-10-CM

## 2015-08-08 DIAGNOSIS — I951 Orthostatic hypotension: Secondary | ICD-10-CM | POA: Diagnosis not present

## 2015-08-08 DIAGNOSIS — R29898 Other symptoms and signs involving the musculoskeletal system: Secondary | ICD-10-CM

## 2015-08-08 DIAGNOSIS — R634 Abnormal weight loss: Secondary | ICD-10-CM

## 2015-08-08 DIAGNOSIS — M5412 Radiculopathy, cervical region: Secondary | ICD-10-CM

## 2015-08-08 DIAGNOSIS — E785 Hyperlipidemia, unspecified: Secondary | ICD-10-CM

## 2015-08-08 DIAGNOSIS — M5416 Radiculopathy, lumbar region: Secondary | ICD-10-CM

## 2015-08-08 DIAGNOSIS — D126 Benign neoplasm of colon, unspecified: Secondary | ICD-10-CM

## 2015-08-08 DIAGNOSIS — R7301 Impaired fasting glucose: Secondary | ICD-10-CM | POA: Diagnosis not present

## 2015-08-08 DIAGNOSIS — Z23 Encounter for immunization: Secondary | ICD-10-CM | POA: Diagnosis not present

## 2015-08-08 DIAGNOSIS — R1030 Lower abdominal pain, unspecified: Secondary | ICD-10-CM

## 2015-08-08 LAB — CBC WITH DIFFERENTIAL/PLATELET
BASOS PCT: 2 % (ref 0.0–3.0)
Basophils Absolute: 0.1 10*3/uL (ref 0.0–0.1)
EOS PCT: 0.5 % (ref 0.0–5.0)
Eosinophils Absolute: 0 10*3/uL (ref 0.0–0.7)
HCT: 39 % (ref 39.0–52.0)
Hemoglobin: 12.9 g/dL — ABNORMAL LOW (ref 13.0–17.0)
LYMPHS ABS: 1.3 10*3/uL (ref 0.7–4.0)
Lymphocytes Relative: 18.2 % (ref 12.0–46.0)
MCHC: 33 g/dL (ref 30.0–36.0)
MCV: 93.3 fl (ref 78.0–100.0)
MONOS PCT: 6.8 % (ref 3.0–12.0)
Monocytes Absolute: 0.5 10*3/uL (ref 0.1–1.0)
NEUTROS PCT: 72.5 % (ref 43.0–77.0)
Neutro Abs: 5.1 10*3/uL (ref 1.4–7.7)
Platelets: 183 10*3/uL (ref 150.0–400.0)
RBC: 4.19 Mil/uL — AB (ref 4.22–5.81)
RDW: 13.7 % (ref 11.5–15.5)
WBC: 7.1 10*3/uL (ref 4.0–10.5)

## 2015-08-08 LAB — URINALYSIS, ROUTINE W REFLEX MICROSCOPIC
BILIRUBIN URINE: NEGATIVE
HGB URINE DIPSTICK: NEGATIVE
Ketones, ur: NEGATIVE
Leukocytes, UA: NEGATIVE
NITRITE: NEGATIVE
RBC / HPF: NONE SEEN (ref 0–?)
Specific Gravity, Urine: 1.02 (ref 1.000–1.030)
TOTAL PROTEIN, URINE-UPE24: NEGATIVE
UROBILINOGEN UA: 0.2 (ref 0.0–1.0)
Urine Glucose: NEGATIVE
pH: 7.5 (ref 5.0–8.0)

## 2015-08-08 LAB — COMPREHENSIVE METABOLIC PANEL
ALBUMIN: 4.2 g/dL (ref 3.5–5.2)
ALT: 18 U/L (ref 0–53)
AST: 14 U/L (ref 0–37)
Alkaline Phosphatase: 64 U/L (ref 39–117)
BILIRUBIN TOTAL: 0.8 mg/dL (ref 0.2–1.2)
BUN: 16 mg/dL (ref 6–23)
CO2: 33 meq/L — AB (ref 19–32)
CREATININE: 0.72 mg/dL (ref 0.40–1.50)
Calcium: 9.3 mg/dL (ref 8.4–10.5)
Chloride: 102 mEq/L (ref 96–112)
GFR: 119.18 mL/min (ref >60.00)
GLUCOSE: 94 mg/dL (ref 70–99)
POTASSIUM: 3.9 meq/L (ref 3.5–5.1)
Sodium: 142 mEq/L (ref 135–145)
Total Protein: 6.6 g/dL (ref 6.0–8.3)

## 2015-08-08 LAB — HEMOGLOBIN A1C: HEMOGLOBIN A1C: 5.5 % (ref 4.6–6.5)

## 2015-08-08 LAB — LIPID PANEL
CHOL/HDL RATIO: 3
Cholesterol: 165 mg/dL (ref 0–200)
HDL: 56.5 mg/dL (ref >39.00)
LDL CALC: 93 mg/dL (ref 0–99)
NONHDL: 108.87
Triglycerides: 78 mg/dL (ref 0.0–149.0)
VLDL: 15.6 mg/dL (ref 0.0–40.0)

## 2015-08-08 NOTE — Progress Notes (Signed)
Subjective:  Patient ID: Jonathon Ralph., male    DOB: 11/15/1955  Age: 59 y.o. MRN: [id number]  CC: The primary encounter diagnosis was Need for prophylactic vaccination and inoculation against influenza. Diagnoses of Loss of weight, Orthostasis, Cervical radiculopathy, chronic, Left arm weakness, Impaired fasting glucose, Hyperlipidemia, Elevated liver enzymes, Suprapubic pain, unspecified laterality, Adenomatous polyp of colon, and Left lumbar radiculitis were also pertinent to this visit.  HPI Jonathon Hill. presents for follow up chronic conditions.  Wife has accompanied  him today due to concern about his ongoing weight loss in the absence of regular exercise. She has noticed loss of  muscle mass, and he has reported symptoms consistent with orthostasis with sudden changes in postion, resulting in near syncope.  No stool changes.  appetite very good .    Has noticed some recurrent epidoes of suprpaubic pain and "gurgling" sensation that is relieved with BM s, which are often accompanied by fecal urgency .  Last colonoscopy 2013,  With 5 year follow up planned  for adeniomas  No headaches , no night sweats  episodes of severe bilateral lower extremity  pain describved as diffuse pain and tingling which occurred in both legs, (normally just occurring on the  left) L>R, no low back pian .  Occurred at 3 am.  Lasted less than 10 minutes, resolved spontaneously. No precipitating event.  [name] today for annual prostate exam .   Shoulder pain  managed by  Sports Medicine    Neck spasms frequent per history . Left arm weakness loss of fine motor skills in hand.  Started with time in boot camp,  injuries sustained during that time.  No prior evaluation with MRI    Outpatient Prescriptions Prior to Visit  Medication Sig Dispense Refill  . acetaminophen (TYLENOL) 325 MG tablet Take 650 mg by mouth as needed.      . citalopram (CELEXA) 20 MG tablet TAKE 1 TABLET DAILY 90 tablet 2    . finasteride (PROSCAR) 5 MG tablet Take 1 tablet (5 mg total) by mouth daily. 90 tablet 1  . gabapentin (NEURONTIN) 600 MG tablet TAKE 2 TABLETS THREE TIMES A DAY 540 tablet 1  . LORazepam (ATIVAN) 2 MG tablet 23m as needed at bedtime for insomnia, and 445m2 hours prior to procedures. 10 tablet 0  . meloxicam (MOBIC) 15 MG tablet TAKE 1 TABLET DAILY 90 tablet 1  . simvastatin (ZOCOR) 20 MG tablet Take 1 tablet (20 mg total) by mouth at bedtime. 90 tablet 3  . tamsulosin (FLOMAX) 0.4 MG CAPS capsule Take 1 capsule (0.4 mg total) by mouth daily. 90 capsule 3  . traMADol (ULTRAM) 50 MG tablet Take 1 tablet (50 mg total) by mouth every 8 (eight) hours as needed. 90 tablet 2   No facility-administered medications prior to visit.    Review of Systems;  Patient denies headache, fevers, malaise, unintentional weight loss, skin rash, eye pain, sinus congestion and sinus pain, sore throat, dysphagia,  hemoptysis , cough, dyspnea, wheezing, chest pain, palpitations, orthopnea, edema, abdominal pain, nausea, melena, diarrhea, constipation, flank pain, dysuria, hematuria, urinary  Frequency, nocturia, numbness, tingling, seizures,  Focal weakness, Loss of consciousness,  Tremor, insomnia, depression, anxiety, and suicidal ideation.      Objective:  BP 120/78 mmHg  Pulse 67  Temp(Src) 98.5 F (36.9 C) (Oral)  Wt 186 lb 3.2 oz (84.46 kg)  SpO2 97%  BP Readings from Last 3 Encounters:  08/08/15  120/78  08/05/15 106/69  01/24/15 118/78    Wt Readings from Last 3 Encounters:  08/08/15 186 lb 3.2 oz (84.46 kg)  08/05/15 188 lb (85.276 kg)  01/24/15 196 lb 8 oz (89.132 kg)    General appearance: alert, cooperative and appears stated age Ears: normal TM's and external ear canals both ears Throat: lips, mucosa, and tongue normal; teeth and gums normal Neck: no adenopathy, no carotid bruit, supple, symmetrical, trachea midline and thyroid not enlarged, symmetric, no tenderness/mass/nodules Back:  asymmetric, scoliosis noted,  ROM normal. No CVA tenderness. MSK: left arm muscle mass and left callf muscle mass diminished compared to right side. Lungs: clear to auscultation bilaterally Heart: regular rate and rhythm, S1, S2 normal, no murmur, click, rub or gallop Abdomen: soft, non-tender; bowel sounds normal; no masses,  no organomegaly Pulses: 2+ and symmetric Skin: Skin color, texture, turgor normal. No rashes or lesions Lymph nodes: Cervical, supraclavicular, and axillary nodes normal.  Lab Results  Component Value Date   HGBA1C 5.5 08/08/2015   HGBA1C 5.7 07/27/2014    Lab Results  Component Value Date   CREATININE 0.72 08/08/2015   CREATININE 0.84 01/24/2015   CREATININE 0.9 07/27/2014    Lab Results  Component Value Date   WBC 7.1 08/08/2015   HGB 12.9* 08/08/2015   HCT 39.0 08/08/2015   PLT 183.0 08/08/2015   GLUCOSE 94 08/08/2015   CHOL 165 08/08/2015   TRIG 78.0 08/08/2015   HDL 56.50 08/08/2015   LDLDIRECT 169.2 07/31/2013   LDLCALC 93 08/08/2015   ALT 18 08/08/2015   AST 14 08/08/2015   NA 142 08/08/2015   K 3.9 08/08/2015   CL 102 08/08/2015   CREATININE 0.72 08/08/2015   BUN 16 08/08/2015   CO2 33* 08/08/2015   TSH 2.760 08/08/2015   PSA 0.12 01/24/2015   HGBA1C 5.5 08/08/2015    Dg Epidural/nerve Root  12/23/2012  *RADIOLOGY REPORT* Clinical Data: 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 Status post laminectomies at L4-5 and L5-S1.  Right S1 radiculitis. Right S1 NERVE ROOT BLOCK WITH TRANSFORAMINAL EPIDURAL STEROID INJECTION Technique:  Overlying skin prepped with Betadine, draped in the usual sterile fashion, and infiltrated locally with 1%  Lidocaine. Curved 22 gauge spinal needle advanced into the caudal epidural space via the right S1 sacral foramen.  Diagnostic injection of 1 ml Omnipaque 180 demonstrates good epidural spread partially outlining the right S1 nerve root. No intravascular uptake.  120 mg Depo-Medrol and 1.5 ml 1%  Lidocaine were then administered.  No immediate complication. FLUORO TIME:  42 seconds IMPRESSION: Technically successful right S1 selective nerve root block with transforaminal epidural steroid injection. Original Report Authenticated By: San Morelle, M.D.    Assessment & Plan:   Problem List Items Addressed This Visit    Left lumbar radiculitis    With loss of muscle mass noted on the left..       Impaired fasting glucose    No signs of DM by A1c.  Lab Results  Component Value Date   HGBA1C 5.5 08/08/2015         Relevant Orders   Hemoglobin A1c (Completed)   Loss of weight    screening with labs, chest x ray and ultrasound of abdomen      Relevant Orders   CEA (Completed)   Comp Met (CMET) (Completed)   Lipid panel (Completed)   Urinalysis, Routine w reflex microscopic (not at [facility]) (Completed)   Prealbumin (Completed)   AFP tumor marker (Completed)  DG Chest 2 View (Completed)   US Abdomen Complete   T4 AND TSH (Completed)   Orthostasis    Secondary to Flomas and finasteride.  CBC normal       Relevant Orders   CBC with Differential/Platelet (Completed)   Urinalysis, Routine w reflex microscopic (not at [facility]) (Completed)   Elevated liver enzymes   Relevant Orders   AFP tumor marker (Completed)   US Abdomen Complete   Hyperlipidemia    Other Visit Diagnoses    Need for prophylactic vaccination and inoculation against influenza    -  Primary    Relevant Orders    Flu Vaccine QUAD 36+ mos IM (Completed)    Cervical radiculopathy, chronic        Relevant Orders    DG Cervical Spine Complete (Completed)    Left arm weakness        Relevant Orders    DG Cervical Spine Complete (Completed)    Suprapubic pain, unspecified laterality        Relevant Orders    CEA (Completed)    POCT urinalysis dipstick    Adenomatous polyp of colon        Relevant Orders    CEA (Completed)     A total of 40 minutes was spent with patient more than half of which  was spent in counseling patient on the above mentioned issues , reviewing and explaining recent labs and imaging studies done, and coordination of care.   I am having Mr. Dollins maintain his acetaminophen, traMADol, citalopram, finasteride, LORazepam, simvastatin, tamsulosin, gabapentin, and meloxicam.  No orders of the defined types were placed in this encounter.    There are no discontinued medications.  Follow-up: No Follow-up on file.   Crecencio Mc, MD

## 2015-08-08 NOTE — Patient Instructions (Signed)
Chest x ray can be done today without an appointment.   Abdominal ultrasound  Ordered and you will be called with appointment.  Home stool testing ordered.

## 2015-08-08 NOTE — Progress Notes (Signed)
Pre visit review using our clinic review tool, if applicable. No additional management support is needed unless otherwise documented below in the visit note. 

## 2015-08-09 LAB — T4 AND TSH
T4, Total: 7.2 ug/dL (ref 4.5–12.0)
TSH: 2.76 u[IU]/mL (ref 0.450–4.500)

## 2015-08-09 LAB — PREALBUMIN: Prealbumin: 21 mg/dL (ref 21–43)

## 2015-08-09 LAB — AFP TUMOR MARKER: AFP-Tumor Marker: 2.2 ng/mL (ref <6.1)

## 2015-08-09 LAB — CEA: CEA: <0.5 ng/mL (ref 0.0–5.0)

## 2015-08-10 ENCOUNTER — Encounter: Payer: Self-pay | Admitting: Internal Medicine

## 2015-08-11 NOTE — Assessment & Plan Note (Signed)
With loss of muscle mass noted on the left.Marland Kitchen

## 2015-08-11 NOTE — Assessment & Plan Note (Signed)
screening with labs, chest x ray and ultrasound of abdomen

## 2015-08-11 NOTE — Assessment & Plan Note (Signed)
No signs of DM by A1c.  Lab Results  Component Value Date   HGBA1C 5.5 08/08/2015

## 2015-08-11 NOTE — Assessment & Plan Note (Signed)
Secondary to Flomas and finasteride.  CBC normal

## 2015-08-15 ENCOUNTER — Ambulatory Visit

## 2015-08-16 ENCOUNTER — Ambulatory Visit (HOSPITAL_COMMUNITY)
Admission: RE | Admit: 2015-08-16 | Discharge: 2015-08-16 | Disposition: A | Discharge status: Home / Self Care | Source: Ambulatory Visit | Attending: Internal Medicine | Admitting: Internal Medicine

## 2015-08-16 DIAGNOSIS — R748 Abnormal levels of other serum enzymes: Secondary | ICD-10-CM | POA: Insufficient documentation

## 2015-08-16 DIAGNOSIS — R634 Abnormal weight loss: Secondary | ICD-10-CM | POA: Insufficient documentation

## 2015-08-18 ENCOUNTER — Other Ambulatory Visit: Payer: Self-pay | Admitting: Internal Medicine

## 2015-08-18 ENCOUNTER — Encounter: Payer: Self-pay | Admitting: Internal Medicine

## 2015-08-18 DIAGNOSIS — R9389 Abnormal findings on diagnostic imaging of other specified body structures: Secondary | ICD-10-CM

## 2015-08-18 DIAGNOSIS — R634 Abnormal weight loss: Secondary | ICD-10-CM

## 2015-08-19 ENCOUNTER — Other Ambulatory Visit: Payer: Self-pay | Admitting: Sports Medicine

## 2015-08-23 ENCOUNTER — Ambulatory Visit

## 2015-08-27 ENCOUNTER — Other Ambulatory Visit: Payer: Self-pay | Admitting: Internal Medicine

## 2015-08-27 ENCOUNTER — Ambulatory Visit (HOSPITAL_COMMUNITY)
Admission: RE | Admit: 2015-08-27 | Discharge: 2015-08-27 | Disposition: A | Discharge status: Home / Self Care | Source: Ambulatory Visit | Attending: Internal Medicine | Admitting: Internal Medicine

## 2015-08-27 ENCOUNTER — Encounter: Payer: Self-pay | Admitting: Internal Medicine

## 2015-08-27 DIAGNOSIS — R918 Other nonspecific abnormal finding of lung field: Secondary | ICD-10-CM | POA: Insufficient documentation

## 2015-08-27 DIAGNOSIS — R634 Abnormal weight loss: Secondary | ICD-10-CM

## 2015-08-27 DIAGNOSIS — N200 Calculus of kidney: Secondary | ICD-10-CM | POA: Insufficient documentation

## 2015-08-27 DIAGNOSIS — I2510 Atherosclerotic heart disease of native coronary artery without angina pectoris: Secondary | ICD-10-CM | POA: Insufficient documentation

## 2015-08-27 DIAGNOSIS — R9389 Abnormal findings on diagnostic imaging of other specified body structures: Secondary | ICD-10-CM

## 2015-08-27 MED ORDER — IOHEXOL 300 MG/ML  SOLN
100.0000 mL | Freq: Once | INTRAMUSCULAR | Status: AC | PRN
  Administered 2015-08-27: 100 mL via INTRAVENOUS

## 2015-09-08 ENCOUNTER — Encounter: Payer: Self-pay | Admitting: Sports Medicine

## 2015-09-08 DIAGNOSIS — M5412 Radiculopathy, cervical region: Secondary | ICD-10-CM

## 2015-09-12 LAB — COLOGUARD: COLOGUARD: NEGATIVE

## 2015-09-13 LAB — HM COLONOSCOPY: HM Colonoscopy: NEGATIVE

## 2015-10-01 ENCOUNTER — Ambulatory Visit (INDEPENDENT_AMBULATORY_CARE_PROVIDER_SITE_OTHER)

## 2015-10-01 DIAGNOSIS — M47892 Other spondylosis, cervical region: Secondary | ICD-10-CM | POA: Diagnosis not present

## 2015-10-01 DIAGNOSIS — M5412 Radiculopathy, cervical region: Secondary | ICD-10-CM

## 2015-10-01 DIAGNOSIS — M1288 Other specific arthropathies, not elsewhere classified, other specified site: Secondary | ICD-10-CM

## 2015-10-04 ENCOUNTER — Ambulatory Visit (INDEPENDENT_AMBULATORY_CARE_PROVIDER_SITE_OTHER): Admitting: Sports Medicine

## 2015-10-04 ENCOUNTER — Encounter: Payer: Self-pay | Admitting: Sports Medicine

## 2015-10-04 VITALS — BP 117/78 | HR 77 | Resp 16 | Wt 193.1 lb

## 2015-10-04 DIAGNOSIS — M5030 Other cervical disc degeneration, unspecified cervical region: Secondary | ICD-10-CM | POA: Insufficient documentation

## 2015-10-04 NOTE — Progress Notes (Signed)
  Subjective:    CC: Follow-up with MRI results  HPI: This is a pleasant 59 year old male, he comes in for follow-up of an MRI, he had left-sided cervical radiculopathy with some wasting of his upper shoulder musculature on the left side. Paresthesias into the left upper arm. Ultimately he failed physical therapy, steroids, NSAIDs so we obtained an MRI, the results of which will be dictated below, symptoms are moderate, persistent.  Past medical history, Surgical history, Family history not pertinant except as noted below, Social history, Allergies, and medications have been entered into the medical record, reviewed, and no changes needed.   Review of Systems: No fevers, chills, night sweats, weight loss, chest pain, or shortness of breath.   Objective:    General: Well Developed, well nourished, and in no acute distress.  Neuro: Alert and oriented x3, extra-ocular muscles intact, sensation grossly intact.  HEENT: Normocephalic, atraumatic, pupils equal round reactive to light, neck supple, no masses, no lymphadenopathy, thyroid nonpalpable.  Skin: Warm and dry, no rashes. Cardiac: Regular rate and rhythm, no murmurs rubs or gallops, no lower extremity edema.  Respiratory: Clear to auscultation bilaterally. Not using accessory muscles, speaking in full sentences.  MRI shows moderate broad-based but predominantly leftward disc protrusions from C4-C7 with multilevel moderate central canal stenosis and moderate to severe left foraminal stenosis at multiple levels.  Impression and Recommendations:    I spent 25 minutes with this patient, greater than 50% was face-to-face time counseling regarding the above diagnoses

## 2015-10-04 NOTE — Assessment & Plan Note (Signed)
Multilevel disc protrusions from C4-C7, no clinical symptoms of myelopathy however does have left radiculopathy with muscle wasting, periscapular and biceps suggestive of C5 versus C6 radiculopathy. At this point we are going to proceed with a left-sided C5-C6 interlaminar epidural, they are living in [facility] so will need to be done down there.

## 2015-10-14 ENCOUNTER — Encounter: Payer: Self-pay | Admitting: Internal Medicine

## 2015-10-16 ENCOUNTER — Telehealth: Payer: Self-pay | Admitting: Internal Medicine

## 2015-10-16 LAB — COLOGUARD: Cologuard: NEGATIVE

## 2015-11-03 ENCOUNTER — Other Ambulatory Visit: Payer: Self-pay | Admitting: Internal Medicine

## 2015-11-15 ENCOUNTER — Other Ambulatory Visit: Payer: Self-pay | Admitting: Sports Medicine

## 2015-11-17 ENCOUNTER — Other Ambulatory Visit: Payer: Self-pay | Admitting: Internal Medicine

## 2015-12-09 ENCOUNTER — Other Ambulatory Visit: Payer: Self-pay | Admitting: Internal Medicine

## 2015-12-27 ENCOUNTER — Ambulatory Visit: Admitting: Internal Medicine

## 2016-01-02 ENCOUNTER — Encounter: Payer: Self-pay | Admitting: Sports Medicine

## 2016-01-02 ENCOUNTER — Ambulatory Visit (INDEPENDENT_AMBULATORY_CARE_PROVIDER_SITE_OTHER): Admitting: Sports Medicine

## 2016-01-02 VITALS — BP 108/67 | HR 76 | Ht 74.0 in | Wt 198.0 lb

## 2016-01-02 DIAGNOSIS — M19049 Primary osteoarthritis, unspecified hand: Secondary | ICD-10-CM

## 2016-01-02 DIAGNOSIS — M25511 Pain in right shoulder: Secondary | ICD-10-CM

## 2016-01-02 DIAGNOSIS — M129 Arthropathy, unspecified: Secondary | ICD-10-CM | POA: Diagnosis not present

## 2016-01-02 DIAGNOSIS — G8929 Other chronic pain: Secondary | ICD-10-CM | POA: Diagnosis not present

## 2016-01-02 DIAGNOSIS — M549 Dorsalgia, unspecified: Secondary | ICD-10-CM

## 2016-01-02 DIAGNOSIS — M5030 Other cervical disc degeneration, unspecified cervical region: Secondary | ICD-10-CM | POA: Diagnosis not present

## 2016-01-02 DIAGNOSIS — M25512 Pain in left shoulder: Secondary | ICD-10-CM

## 2016-01-02 MED ORDER — LORAZEPAM 2 MG PO TABS: ORAL_TABLET | ORAL | Status: DC

## 2016-01-02 MED ORDER — TRAMADOL HCL 50 MG PO TABS: 50.0000 mg | ORAL_TABLET | Freq: Three times a day (TID) | ORAL | Status: AC | PRN

## 2016-01-02 NOTE — Assessment & Plan Note (Signed)
Right third and fourth metacarpophalangeal joint injections.

## 2016-01-02 NOTE — Progress Notes (Signed)
  Subjective:    CC: Shoulder and hand pain  HPI: Left shoulder pain: Did well for a proximal he 6 months after subacromial injection, right shoulder is pain-free, requires repeat injection left shoulder.  Right hand osteoarthritis: His done well for 6 months after third and fourth MCP injections, desires repeat interventional treatment today.  Past medical history, Surgical history, Family history not pertinant except as noted below, Social history, Allergies, and medications have been entered into the medical record, reviewed, and no changes needed.   Review of Systems: No fevers, chills, night sweats, weight loss, chest pain, or shortness of breath.   Objective:    General: Well Developed, well nourished, and in no acute distress.  Neuro: Alert and oriented x3, extra-ocular muscles intact, sensation grossly intact.  HEENT: Normocephalic, atraumatic, pupils equal round reactive to light, neck supple, no masses, no lymphadenopathy, thyroid nonpalpable.  Skin: Warm and dry, no rashes. Cardiac: Regular rate and rhythm, no murmurs rubs or gallops, no lower extremity edema.  Respiratory: Clear to auscultation bilaterally. Not using accessory muscles, speaking in full sentences.  Procedure: Real-time Ultrasound Guided Injection of left subacromial bursa Device: GE Logiq E  Verbal informed consent obtained.  Time-out conducted.  Noted no overlying erythema, induration, or other signs of local infection.  Skin prepped in a sterile fashion.  Local anesthesia: Topical Ethyl chloride.  With sterile technique and under real time ultrasound guidance:  25-gauge needle advanced into the bursa and 1 mL Kenalog 40 with 3 mL lidocaine injected easily. Completed without difficulty  Pain immediately resolved suggesting accurate placement of the medication.  Advised to call if fevers/chills, erythema, induration, drainage, or persistent bleeding.  Images permanently stored and available for review in  the ultrasound unit.  Impression: Technically successful ultrasound guided injection.  Procedure: Real-time Ultrasound Guided Injection of right third MCP Device: GE Logiq E  Verbal informed consent obtained.  Time-out conducted.  Noted no overlying erythema, induration, or other signs of local infection.  Skin prepped in a sterile fashion.  Local anesthesia: Topical Ethyl chloride.  With sterile technique and under real time ultrasound guidance:  1/2 mL Kenalog 40 1/2 mL lidocaine injected easily. Completed without difficulty  Pain immediately resolved suggesting accurate placement of the medication.  Advised to call if fevers/chills, erythema, induration, drainage, or persistent bleeding.  Images permanently stored and available for review in the ultrasound unit.  Impression: Technically successful ultrasound guided injection.  .Procedure: Real-time Ultrasound Guided Injection of right fourth MCP Device: GE Logiq E  Verbal informed consent obtained.  Time-out conducted.  Noted no overlying erythema, induration, or other signs of local infection.  Skin prepped in a sterile fashion.  Local anesthesia: Topical Ethyl chloride.  With sterile technique and under real time ultrasound guidance:  1/2 mL Kenalog 40 1/2 mL lidocaine injected easily. Completed without difficulty  Pain immediately resolved suggesting accurate placement of the medication.  Advised to call if fevers/chills, erythema, induration, drainage, or persistent bleeding.  Images permanently stored and available for review in the ultrasound unit.  Impression: Technically successful ultrasound guided injection.  Impression and Recommendations:

## 2016-01-02 NOTE — Assessment & Plan Note (Addendum)
Left subacromial injection. Return as needed

## 2016-01-02 NOTE — Assessment & Plan Note (Signed)
Multilevel disc protrusions from C4-C7, no myelopathy however with left radiculopathy with muscle wasting predominantly periscapular and biceps suggestive of C5 and C6 radiculopathy. He did well with his prior cervical epidural, eventually he is going to get another epidural, they will call and we will order it.

## 2016-01-03 DIAGNOSIS — Z8614 Personal history of Methicillin resistant Staphylococcus aureus infection: Secondary | ICD-10-CM

## 2016-01-03 HISTORY — DX: Personal history of Methicillin resistant Staphylococcus aureus infection: Z86.14

## 2016-02-13 ENCOUNTER — Telehealth: Payer: Self-pay

## 2016-02-13 DIAGNOSIS — M5030 Other cervical disc degeneration, unspecified cervical region: Secondary | ICD-10-CM

## 2016-02-13 NOTE — Telephone Encounter (Signed)
Looks like he was getting cervical epidurals, we are going to proceed with a left C6-C7 interlaminar epidural

## 2016-02-13 NOTE — Telephone Encounter (Signed)
Pt records from [facility] are in the Media tab dated 12/03/15 pt wife would like to have pt 3rd shot ordered at [place]. Please assist.

## 2016-02-13 NOTE — Telephone Encounter (Signed)
[facility] called

## 2016-02-27 ENCOUNTER — Telehealth: Payer: Self-pay | Admitting: *Deleted

## 2016-02-27 DIAGNOSIS — E785 Hyperlipidemia, unspecified: Secondary | ICD-10-CM

## 2016-02-27 DIAGNOSIS — N401 Enlarged prostate with lower urinary tract symptoms: Secondary | ICD-10-CM

## 2016-02-27 DIAGNOSIS — R7301 Impaired fasting glucose: Secondary | ICD-10-CM

## 2016-02-27 DIAGNOSIS — R338 Other retention of urine: Secondary | ICD-10-CM

## 2016-02-27 DIAGNOSIS — R748 Abnormal levels of other serum enzymes: Secondary | ICD-10-CM

## 2016-02-27 DIAGNOSIS — R5383 Other fatigue: Secondary | ICD-10-CM

## 2016-02-27 NOTE — Telephone Encounter (Signed)
Patient requested to have labs ordered before appt on 05/27/16

## 2016-03-04 ENCOUNTER — Inpatient Hospital Stay
Admission: EM | Admit: 2016-03-04 | Discharge: 2016-03-08 | DRG: 603 | Disposition: A | Discharge status: Home / Self Care | Attending: Internal Medicine | Admitting: Internal Medicine

## 2016-03-04 ENCOUNTER — Inpatient Hospital Stay: Admitting: Internal Medicine

## 2016-03-04 DIAGNOSIS — F329 Major depressive disorder, single episode, unspecified: Secondary | ICD-10-CM | POA: Diagnosis present

## 2016-03-04 DIAGNOSIS — F1593 Other stimulant use, unspecified with withdrawal: Secondary | ICD-10-CM | POA: Diagnosis not present

## 2016-03-04 DIAGNOSIS — R51 Headache: Secondary | ICD-10-CM | POA: Diagnosis not present

## 2016-03-04 DIAGNOSIS — D72829 Elevated white blood cell count, unspecified: Secondary | ICD-10-CM | POA: Diagnosis present

## 2016-03-04 DIAGNOSIS — R509 Fever, unspecified: Secondary | ICD-10-CM

## 2016-03-04 DIAGNOSIS — S80861A Insect bite (nonvenomous), right lower leg, initial encounter: Secondary | ICD-10-CM

## 2016-03-04 DIAGNOSIS — F419 Anxiety disorder, unspecified: Secondary | ICD-10-CM | POA: Diagnosis present

## 2016-03-04 DIAGNOSIS — G629 Polyneuropathy, unspecified: Secondary | ICD-10-CM | POA: Diagnosis present

## 2016-03-04 DIAGNOSIS — L02415 Cutaneous abscess of right lower limb: Secondary | ICD-10-CM | POA: Diagnosis present

## 2016-03-04 DIAGNOSIS — W57XXXA Bitten or stung by nonvenomous insect and other nonvenomous arthropods, initial encounter: Secondary | ICD-10-CM | POA: Diagnosis present

## 2016-03-04 DIAGNOSIS — B9562 Methicillin resistant Staphylococcus aureus infection as the cause of diseases classified elsewhere: Secondary | ICD-10-CM | POA: Diagnosis present

## 2016-03-04 DIAGNOSIS — L02419 Cutaneous abscess of limb, unspecified: Secondary | ICD-10-CM | POA: Diagnosis present

## 2016-03-04 DIAGNOSIS — L03115 Cellulitis of right lower limb: Principal | ICD-10-CM | POA: Diagnosis present

## 2016-03-04 DIAGNOSIS — N400 Enlarged prostate without lower urinary tract symptoms: Secondary | ICD-10-CM | POA: Diagnosis present

## 2016-03-04 DIAGNOSIS — M5126 Other intervertebral disc displacement, lumbar region: Secondary | ICD-10-CM | POA: Diagnosis present

## 2016-03-04 LAB — CBC AND DIFFERENTIAL
Basophils %: 0.4 % (ref 0.0–3.0)
Basophils Absolute: 0 10*3/uL (ref 0.0–0.3)
Eosinophils %: 1 % (ref 0.0–7.0)
Eosinophils Absolute: 0.1 10*3/uL (ref 0.0–0.8)
Hematocrit: 36.2 % — ABNORMAL LOW (ref 39.0–52.5)
Hemoglobin: 11.9 gm/dL — ABNORMAL LOW (ref 13.0–17.5)
Lymphocytes Absolute: 1.4 10*3/uL (ref 0.6–5.1)
Lymphocytes: 11.3 % — ABNORMAL LOW (ref 15.0–46.0)
MCH: 33 pg (ref 28–35)
MCHC: 33 gm/dL (ref 32–36)
MCV: 99 fL (ref 80–100)
MPV: 8.4 fL (ref 6.0–10.0)
Monocytes Absolute: 1 10*3/uL (ref 0.1–1.7)
Monocytes: 8.4 % (ref 3.0–15.0)
Neutrophils %: 79 % — ABNORMAL HIGH (ref 42.0–78.0)
Neutrophils Absolute: 9.5 10*3/uL — ABNORMAL HIGH (ref 1.7–8.6)
PLT CT: 162 10*3/uL (ref 130–440)
RBC: 3.68 10*6/uL — ABNORMAL LOW (ref 4.00–5.70)
RDW: 11.3 % (ref 11.0–14.0)
WBC: 12 10*3/uL — ABNORMAL HIGH (ref 4.0–11.0)

## 2016-03-04 LAB — LACTIC ACID, PLASMA: Lactic Acid: 0.79 mMol/L (ref 0.50–2.10)

## 2016-03-04 LAB — ECG 12-LEAD
P Wave Axis: 61 deg
P-R Interval: 161 ms
Patient Age: 59 years
Q-T Interval(Corrected): 429 ms
Q-T Interval: 367 ms
QRS Axis: 63 deg
QRS Duration: 100 ms
T Axis: 49 years
Ventricular Rate: 82 //min

## 2016-03-04 LAB — HEPATIC FUNCTION PANEL
ALT: 49 U/L (ref 0–55)
AST (SGOT): 47 U/L — ABNORMAL HIGH (ref 10–42)
Albumin/Globulin Ratio: 0.91 Ratio (ref 0.70–1.50)
Albumin: 3.2 gm/dL — ABNORMAL LOW (ref 3.5–5.0)
Alkaline Phosphatase: 67 U/L (ref 40–145)
Bilirubin Direct: 0.3 mg/dL (ref 0.0–0.3)
Bilirubin, Total: 0.7 mg/dL (ref 0.1–1.2)
Globulin: 3.5 gm/dL (ref 2.0–4.0)
Protein, Total: 6.7 gm/dL (ref 6.0–8.3)

## 2016-03-04 LAB — BASIC METABOLIC PANEL
Anion Gap: 10.8 mMol/L (ref 7.0–18.0)
BUN / Creatinine Ratio: 19.4 Ratio (ref 10.0–30.0)
BUN: 20 mg/dL (ref 7–22)
CO2: 26.8 mMol/L (ref 20.0–30.0)
Calcium: 8.6 mg/dL (ref 8.5–10.5)
Chloride: 101 mMol/L (ref 98–110)
Creatinine: 1.03 mg/dL (ref 0.80–1.30)
EGFR: 80 mL/min/{1.73_m2} (ref 60–150)
Glucose: 102 mg/dL — ABNORMAL HIGH (ref 70–99)
Osmolality Calc: 273 mOsm/kg — ABNORMAL LOW (ref 275–300)
Potassium: 3.6 mMol/L (ref 3.5–5.3)
Sodium: 135 mMol/L — ABNORMAL LOW (ref 136–147)

## 2016-03-04 LAB — CK: Creatine Kinase (CK): 98 U/L (ref 30–230)

## 2016-03-04 MED ORDER — SODIUM CHLORIDE 0.9 % IV MBP
2.0000 g | Freq: Once | INTRAVENOUS | Status: AC
Start: 2016-03-04 — End: 2016-03-04
  Administered 2016-03-04: 2 g via INTRAVENOUS

## 2016-03-04 MED ORDER — ONDANSETRON 4 MG PO TBDP
4.0000 mg | ORAL_TABLET | Freq: Three times a day (TID) | ORAL | Status: DC | PRN
Start: 2016-03-04 — End: 2016-03-08
  Filled 2016-03-04: qty 1

## 2016-03-04 MED ORDER — VANCOMYCIN HCL IN DEXTROSE 1-5 GM/200ML-% IV SOLN
INTRAVENOUS | Status: AC
Start: 2016-03-04 — End: ?
  Filled 2016-03-04: qty 1000

## 2016-03-04 MED ORDER — ENOXAPARIN SODIUM 40 MG/0.4ML SC SOLN
40.0000 mg | SUBCUTANEOUS | Status: DC
Start: 2016-03-05 — End: 2016-03-08
  Administered 2016-03-05 – 2016-03-07 (×3): 40 mg via SUBCUTANEOUS
  Filled 2016-03-04 (×4): qty 0.4

## 2016-03-04 MED ORDER — KETOROLAC TROMETHAMINE 30 MG/ML IJ SOLN
30.0000 mg | Freq: Once | INTRAMUSCULAR | Status: AC
Start: 2016-03-04 — End: 2016-03-04
  Administered 2016-03-04: 30 mg via INTRAVENOUS

## 2016-03-04 MED ORDER — SODIUM CHLORIDE 0.9 % IV MBP
2.0000 g | INTRAVENOUS | Status: DC
Start: 2016-03-05 — End: 2016-03-06
  Administered 2016-03-05: 2 g via INTRAVENOUS
  Filled 2016-03-04: qty 2000
  Filled 2016-03-04: qty 100

## 2016-03-04 MED ORDER — ACETAMINOPHEN 325 MG PO TABS
650.0000 mg | ORAL_TABLET | ORAL | Status: DC | PRN
Start: 2016-03-04 — End: 2016-03-08
  Administered 2016-03-05 – 2016-03-07 (×6): 650 mg via ORAL
  Filled 2016-03-04 (×6): qty 2

## 2016-03-04 MED ORDER — OXYCODONE-ACETAMINOPHEN 5-325 MG PO TABS
1.0000 | ORAL_TABLET | ORAL | Status: DC | PRN
Start: 2016-03-04 — End: 2016-03-08
  Administered 2016-03-05 – 2016-03-08 (×2): 1 via ORAL
  Administered 2016-03-08: 2 via ORAL
  Filled 2016-03-04: qty 2
  Filled 2016-03-04 (×2): qty 1

## 2016-03-04 MED ORDER — ACETAMINOPHEN 650 MG RE SUPP
650.0000 mg | RECTAL | Status: DC | PRN
Start: 2016-03-04 — End: 2016-03-08

## 2016-03-04 MED ORDER — CEFTRIAXONE SODIUM 2 G IJ SOLR
INTRAMUSCULAR | Status: AC
Start: 2016-03-04 — End: ?
  Filled 2016-03-04: qty 2000

## 2016-03-04 MED ORDER — MORPHINE SULFATE 2 MG/ML IJ/IV SOLN (WRAP)
2.0000 mg | Status: DC | PRN
Start: 2016-03-04 — End: 2016-03-08

## 2016-03-04 MED ORDER — ACETAMINOPHEN 500 MG PO TABS
ORAL_TABLET | ORAL | Status: AC
Start: 2016-03-04 — End: ?
  Filled 2016-03-04: qty 2

## 2016-03-04 MED ORDER — VANCOMYCIN HCL IN DEXTROSE 1-5 GM/200ML-% IV SOLN
1000.0000 mg | Freq: Once | INTRAVENOUS | Status: AC
Start: 2016-03-04 — End: 2016-03-05
  Administered 2016-03-04: 1000 mg via INTRAVENOUS

## 2016-03-04 MED ORDER — ACETAMINOPHEN 160 MG/5ML PO SOLN
650.0000 mg | ORAL | Status: DC | PRN
Start: 2016-03-04 — End: 2016-03-08

## 2016-03-04 MED ORDER — SODIUM CHLORIDE 0.9 % IV BOLUS
1000.0000 mL | Freq: Once | INTRAVENOUS | Status: AC
Start: 2016-03-04 — End: 2016-03-04
  Administered 2016-03-04: 1000 mL via INTRAVENOUS

## 2016-03-04 MED ORDER — VH BIO-K PLUS PROBIOTIC 50 BIL CFU CAPSULE
50.0000 | DELAYED_RELEASE_CAPSULE | Freq: Every day | ORAL | Status: DC
Start: 2016-03-05 — End: 2016-03-08
  Administered 2016-03-05 – 2016-03-08 (×4): 50 via ORAL
  Filled 2016-03-04 (×3): qty 1

## 2016-03-04 MED ORDER — ACETAMINOPHEN 500 MG PO TABS
1000.0000 mg | ORAL_TABLET | Freq: Once | ORAL | Status: AC
Start: 2016-03-04 — End: 2016-03-04
  Administered 2016-03-04: 1000 mg via ORAL

## 2016-03-04 MED ORDER — ONDANSETRON HCL 4 MG/2ML IJ SOLN
4.0000 mg | Freq: Three times a day (TID) | INTRAMUSCULAR | Status: DC | PRN
Start: 2016-03-04 — End: 2016-03-08

## 2016-03-04 MED ORDER — KETOROLAC TROMETHAMINE 30 MG/ML IJ SOLN
INTRAMUSCULAR | Status: AC
Start: 2016-03-04 — End: ?
  Filled 2016-03-04: qty 1

## 2016-03-04 NOTE — ED Notes (Signed)
Pt first noticed a bug bite about a week ago behind his right knee.  Pt states there has been increased swelling and redness to the area since Sunday.  Pt states he is currently unable to bend right leg.

## 2016-03-04 NOTE — H&P (Signed)
MEDICINE ADMISSION NOTE      Patient: Vincent Vega.  Date: 03/04/2016   DOB: [birth year]  Admission Date: 03/04/2016   MRN: [id number]  Attending: Call, Hall Busing, MD                                  Chief complaint : Fever and leg cellulitis    HPI:    Vincent Vega is a 60 y.o.  male with    Patient is 60 year old male with past medical history of BPH, depression and chronic back pain came to the emergency room today for increasing redness, swelling and pain in right lower extremity and high-grade fever since Monday. Patient lives in a RV and spends most of the time outside. Patient has frequent tick and bug bites. Patient had some unknown bug bite to his right calf and lower thigh area 1 week ago. Patient noticed redness and swelling since Sunday, which has been progressively getting worse. Patient then started having high-grade fever up to 102.4 and has been taking Tylenol. Patient had doxycycline, which he started taking since yesterday. Today patient drove down to this area, and decided to seek medical attention. In the emergency room patient is noted to have cellulitis of the right calf extending up to the posterior lower thigh. 2 bug bites with surrounding induration noted, but no obvious fluctuance or abscess seen. In the emergency room patient had wound culture sent. Patient received IV vancomycin and ceftriaxone and recommended admission for IV antibiotics. Presently patient is resting comfortably but complains of pain with touch and movement of the right knee but no joint pain.    Past Medical History   Past Medical History   Diagnosis Date   . Extruding scleral buckle    . BPH (benign prostatic hyperplasia)    . Depression    . Lumbar herniated disc            Past Surgical History  History reviewed. No pertinent past surgical history.    presented to [facility] ER      Allergies  No Known Allergies    Prior to Admission Medications  Current Facility-Administered Medications      Medication Dose Route Frequency Provider Last Rate Last Dose   . vancomycin (VANCOCIN) 1 g/200 mL dextrose IVPB (premix)  1,000 mg Intravenous Once in ED Call, Hall Busing, MD         No current outpatient prescriptions on file.         Social History  Social History   Substance Use Topics   . Smoking status: Never Smoker    . Smokeless tobacco: Not on file   . Alcohol Use: Yes      Comment: occasional       Family History: Reviewed and negative except what's stated below  No family history on file.    Review of Systems  Except pertinent positives and negatives as mentioned in the history of present illness and past medical history, all other review of systems including constitutional, eyes, ENT, cardiovascular, respiratory, gastrointestinal, genitourinary, psychiatric, neurologic, integumentary, musculoskeletal and allergic/hematology are reviewed and found unremarkable.       Physical Exam  BP 138/72 mmHg  Pulse 89  Temp(Src) 101.1 F (38.4 C) (Oral)  Resp 21  Ht 1.88 m (6\' 2" )  Wt 89.7 kg (197 lb 12 oz)  BMI 25.38 kg/m2  SpO2 98%  No intake or  output data in the 24 hours ending 03/04/16 2158  Weight Monitoring 03/04/2016   Height 188 cm   Height Method Stated   Weight 89.7 kg   Weight Method Actual   BMI (calculated) 25.4 kg/m2          1) General appearance:  Moderately built, in no apparent acute cardiorespiratory distress.     2) HEENT: Head is atraumatic and normocephalic. Pupils are equally reactive to light and accommodation. Extraocular muscles are intact. Patient has intact external auditory canal. No abnormal lesions or bleeding from nose. Oral mucosa moist with no pharyngeal congestion.     3) Neck: Supple. Trachea is central, no JVD or carotid bruit.     4) Chest: Clear to auscultation bilaterally, no wheezing, non labored breathing     6) CVS: The S1, S2 normal. Regular rate and rhythm. No murmur    7) Abdomen:  soft, non tender, no palpable mass. Bowel sounds audible. No hepatosplenomegaly.       8) Musculoskeletal: No pitting edema legs. No clubbing or cyanosis bilateral lower extremities. Expected range of motion in all four extremities.     9) Neurological: Cranial nerves II-XII intact. Grossly non focal    10) Psychiatric: Alert and oriented x 3. Mood is appropriate.     11) Integumentary: warm, moist with normal skin turgor, no rash    12) Lymphatics: No lymphadenopathy in axillary, cervial and inguinal area.   13) skin: There is area of redness, warmth and tenderness extending from right lower posterior thigh to the calf and firm and slightly indurated area noted on the posterior aspect of the right knee surrounding 2 bug bites, no obvious discharge noted.      Diagnostic work up:     Labs:     [employer] 03/04/16  1940   WBC 12.0*   RBC 3.68*   HEMOGLOBIN 11.9*   HEMATOCRIT 36.2*   MCV 99   PLT CT 162         Recent Labs  Lab 03/04/16  1940   SODIUM 135*   POTASSIUM 3.6   CHLORIDE 101   CO2 26.8   BUN 20   CREATININE 1.03   GLUCOSE 102*   CALCIUM 8.6         Recent Labs  Lab 03/04/16  1940   ALT 49   AST (SGOT) 47*   BILIRUBIN, TOTAL 0.7   BILIRUBIN, DIRECT 0.3   ALBUMIN 3.2*   ALKALINE PHOSPHATASE 67         Recent Labs  Lab 03/04/16  1940   CREATINE KINASE (CK) 98             Invalid input(s): [id number]  Imaging Studies:   (CXR reviewed by me today)   No results found.    Microbiology and/or telemetry results:   Recent Blood culture: In progress  Wound culture:In progress  EKG done in the ER and reviewed by me showed Sinus rhythm rate of 82   Normal PR and QT and Axis   Baseline wander in lead(s) V5   Non specific ST changes    Assessment and Plan:     #Right lower extremity cellulitis secondary to bug bite.  We'll continue patient on IV vancomycin and Zosyn for now.  Follow-up on wound culture.  We'll check Lyme serology.  Consider ID consult.    #Fever, likely secondary to above.  Continue current management.  Lactic acid level is 0.79.  Follow up on  blood culture.    #History of  BPH  Continue home dose of Flomax and Proscar.    #History of chronic back pain.  Continue when necessary tramadol, Percocet and morphine.    #History of anxiety and depression.  Continue Celexa and when necessary Ativan.     #History of peripheral neuropathy  Continue home dose of Neurontin.      CODE Status: Full code    Healthcare Proxy: Patient's wife    DVT prophylaxis: Lovenox       Brantley Persons  03/04/2016    9:58 PM      Note: This chart was generated by the Epic EMR system/speech recognition and may contain inherent errors or omissions not intended by the user. Grammatical errors, random word insertions, deletions, pronoun errors and incomplete sentences are occasional consequences of this technology due to software limitations. Not all errors are caught or corrected. If there are questions or concerns about the content of this note or information contained within the body of this dictation they should be addressed directly with the author for clarification

## 2016-03-04 NOTE — ED Provider Notes (Signed)
[facility] EMERGENCY DEPARTMENT History and Physical Exam      Patient Name: Vincent Vega, Vincent Vega.  Encounter Date:  03/04/2016  Attending Physician: Hall Busing Fernande Treiber, MD  PCP: Christa See, MD  Patient DOB:  September 11, 1956  MRN:  [id number]  Room:  S21/S21-A      History of Presenting Illness     Chief complaint: Fever and Leg Swelling    HPI/ROS is limited by: none  HPI/ROS given by: patient and family    Location: right leg  Duration: the past three days getting worse now  Severity: moderate    Vincent Vega Vincent Vega. is a 60 y.o. male who presents with moderate redness and pain and hardness to the right leg over the past three days. It seemed to start as a bite on the lateral side of the upper calf and then the redness began spreading until today he spiked a fever to 101-102. He is not diabetic and they travel in an RV permanently and do spend a lot of time outside. He has frequent tick bites and is not sure if this started as that. He has doxy he carries with him and took two today. He denies cough congestion or other issues.      Review of Systems     Review of Systems   Constitutional: Positive for fever and chills.   HENT: Negative for congestion, rhinorrhea and sore throat.    Eyes: Negative for visual disturbance.   Respiratory: Negative for cough, chest tightness and shortness of breath.    Cardiovascular: Negative for chest pain and leg swelling.   Gastrointestinal: Negative for nausea, vomiting, abdominal pain and diarrhea.   Genitourinary: Negative for dysuria and frequency.   Musculoskeletal: Positive for myalgias.   Skin: Positive for color change, rash and wound.   Neurological: Negative for dizziness and headaches.   Psychiatric/Behavioral: Negative for confusion and agitation.         Allergies     Pt has No Known Allergies.    Medications     No current outpatient prescriptions on file.     Past Medical History     Pt has a past medical history of Extruding scleral buckle; BPH (benign prostatic  hyperplasia); Depression; and Lumbar herniated disc.    Past Surgical History     Pt has no past surgical history on file.    Family History     The family history is not on file.    Social History     Pt reports that he has never smoked. He does not have any smokeless tobacco history on file. He reports that he drinks alcohol. He reports that he does not use illicit drugs.    Physical Exam     Blood pressure 138/72, pulse 87, temperature 101.1 F (38.4 C), temperature source Oral, resp. rate 18, height 1.88 m, weight 89.7 kg, SpO2 100 %.    Physical Exam   Constitutional: He is oriented to person, place, and time. He appears well-developed.   HENT:   Head: Normocephalic.   Right Ear: External ear normal.   Left Ear: External ear normal.   Mouth/Throat: Oropharynx is clear and moist.   Eyes: Conjunctivae are normal. Pupils are equal, round, and reactive to light. No scleral icterus.   Neck: Neck supple. No JVD present.   Cardiovascular: Regular rhythm.  Exam reveals no gallop and no friction rub.    No murmur heard.  Tachycardia     Pulmonary/Chest: Effort normal. No respiratory  distress. He has no wheezes.   Abdominal: Soft. Bowel sounds are normal. He exhibits no distension. There is no tenderness.   Musculoskeletal: Normal range of motion. He exhibits no edema.   Lymphadenopathy:     He has no cervical adenopathy.   Neurological: He is alert and oriented to person, place, and time.   Skin: Skin is dry. Rash (hard tense cellulitis with hot red skin to the right lateral thigh and down the calf. small bites, one in the middle with a tiny blister on it and crusting. no homans does not seem to be a DVT) noted. There is erythema.   Psychiatric: He has a normal mood and affect. Judgment normal.   Nursing note and vitals reviewed.       Orders Placed     Orders Placed This Encounter   Procedures   . Blood Culture - Venipuncture #1   . Wound Culture and Smear   . Babesia Smear   . CBC and differential   . Basic Metabolic  Panel   . Ehrlichia chaffeensis Aby IgG IgM   . Lyme Ab Tot Rflx to WB IGG/IGM   . Rickettsia Aby Panel with Rflx Titers   . Creatine Kinase (CK)   . LFTs   . Lactic acid   . ECG 12 lead (Stat) (Cardiac Related)   . Saline lock IV   . ED Admission Request       Diagnostic Results       The results of the diagnostic studies below have been reviewed by myself:    Labs  Results     Procedure Component Value Units Date/Time    LFTs [[id number]] Collected:  03/04/16 1940    Specimen Information:  Plasma Updated:  03/04/16 2053    Creatine Kinase (CK) [[id number]] Collected:  03/04/16 1940    Specimen Information:  Plasma Updated:  03/04/16 2053    Rickettsia Aby Panel with Rflx Titers [[id number]] Collected:  03/04/16 1940    Specimen Information:  Blood Updated:  03/04/16 2053    Lyme Ab Tot Rflx to WB IGG/IGM [[id number]] Collected:  03/04/16 1940    Specimen Information:  Blood Updated:  03/04/16 2053    Ehrlichia chaffeensis Aby IgG IgM [[id number]] Collected:  03/04/16 1940    Specimen Information:  Blood Updated:  03/04/16 2053    Wound Culture and Smear [[id number]] Collected:  03/04/16 2025    Specimen Information:  Wound from Leg Updated:  03/04/16 2053    Babesia Smear [[id number]] Collected:  03/04/16 1940    Specimen Information:  Blood Updated:  03/04/16 2039    Basic Metabolic Panel [[id number]]  (Abnormal) Collected:  03/04/16 1940    Specimen Information:  Plasma Updated:  03/04/16 2012     Sodium 135 (L) mMol/L      Potassium 3.6 mMol/L      Chloride 101 mMol/L      CO2 26.8 mMol/L      Calcium 8.6 mg/dL      Glucose 756 (H) mg/dL      Creatinine 4.33 mg/dL      BUN 20 mg/dL      Anion Gap 29.5 mMol/L      BUN/Creatinine Ratio 19.4 Ratio      EGFR 80 mL/min/1.4m2      Osmolality Calc 273 (L) mOsm/kg     CBC and differential [[id number]]  (Abnormal) Collected:  03/04/16 1940    Specimen Information:  Blood from Blood Updated:  03/04/16 1951  WBC 12.0 (H) K/cmm      RBC 3.68 (L) M/cmm      Hemoglobin 11.9 (L)  gm/dL      Hematocrit 08.6 (L) %      MCV 99 fL      MCH 33 pg      MCHC 33 gm/dL      RDW 57.8 %      PLT CT 162 K/cmm      MPV 8.4 fL      NEUTROPHIL % 79.0 (H) %      Lymphocytes 11.3 (L) %      Monocytes 8.4 %      Eosinophils % 1.0 %      Basophils % 0.4 %      Neutrophils Absolute 9.5 (H) K/cmm      Lymphocytes Absolute 1.4 K/cmm      Monocytes Absolute 1.0 K/cmm      Eosinophils Absolute 0.1 K/cmm      BASO Absolute 0.0 K/cmm           Radiologic Studies  Radiology Results (24 Hour)     ** No results found for the last 24 hours. **            MDM / Critical Care     Blood pressure 138/72, pulse 87, temperature 101.1 F (38.4 C), temperature source Oral, resp. rate 18, height 1.88 m, weight 89.7 kg, SpO2 100 %.    The patient presents to the Emergency Department with soft tissue infection such as cellulitis or abscess. Evaluation and treatment has been initiated in the Emergency Department, but the patient has not had significant improvement in symptoms, and/or has fever/leukocytosis, and/or has factors such as co-morbidities that make admission for IV antibiotics the most appropriate disposition. Differential diagnosis includes but is not limited to cellulitis, sepsis, osteomyelitis, abscess, rapidly progressive infection such as strep or gangrene. Diagnostic impression and plan were discussed with the patient and/or family.  If ordered the results of lab/radiology tests were discussed with the patient and/or family. All questions were answered and concerns addressed.  Appropriate consultation was made for admission and further treatment of this patient.    The patient had < 30 min of critical care time required to treat and stabilize.      Called Dr. [facility] at 9:04 PM   for consultation to discuss the disposition of this patient.          Procedures     Wound culture obtained by me, popped small blister and was able to get a little dot of pus.    EKG     none    Diagnosis / Disposition     Clinical  Impression  1. Fever and chills    2. Cellulitis of right leg    3. Tick bites, initial encounter        Disposition  ED Disposition     Admit Admission Comment: med tele HMP            Prescriptions  New Prescriptions    No medications on file                   Deshondra Worst, Hall Busing, MD  03/04/16 2105

## 2016-03-04 NOTE — ED Notes (Signed)
Blood culture collected

## 2016-03-04 NOTE — ED Notes (Signed)
Pt condition stable, denies current needs. Vitals stable, ready for transport.

## 2016-03-05 LAB — CBC AND DIFFERENTIAL
Basophils %: 0.5 % (ref 0.0–3.0)
Basophils Absolute: 0 10*3/uL (ref 0.0–0.3)
Eosinophils %: 3.1 % (ref 0.0–7.0)
Eosinophils Absolute: 0.3 10*3/uL (ref 0.0–0.8)
Hematocrit: 34.7 % — ABNORMAL LOW (ref 39.0–52.5)
Hemoglobin: 11.3 gm/dL — ABNORMAL LOW (ref 13.0–17.5)
Lymphocytes Absolute: 1.2 10*3/uL (ref 0.6–5.1)
Lymphocytes: 13.9 % — ABNORMAL LOW (ref 15.0–46.0)
MCH: 33 pg (ref 28–35)
MCHC: 33 gm/dL (ref 32–36)
MCV: 100 fL (ref 80–100)
MPV: 9 fL (ref 6.0–10.0)
Monocytes Absolute: 0.8 10*3/uL (ref 0.1–1.7)
Monocytes: 8.9 % (ref 3.0–15.0)
Neutrophils %: 73.5 % (ref 42.0–78.0)
Neutrophils Absolute: 6.4 10*3/uL (ref 1.7–8.6)
PLT CT: 152 10*3/uL (ref 130–440)
RBC: 3.46 10*6/uL — ABNORMAL LOW (ref 4.00–5.70)
RDW: 11.3 % (ref 11.0–14.0)
WBC: 8.7 10*3/uL (ref 4.0–11.0)

## 2016-03-05 LAB — BASIC METABOLIC PANEL
Anion Gap: 9.1 mMol/L (ref 7.0–18.0)
BUN / Creatinine Ratio: 19.8 Ratio (ref 10.0–30.0)
BUN: 16 mg/dL (ref 7–22)
CO2: 27.6 mMol/L (ref 20.0–30.0)
Calcium: 8.6 mg/dL (ref 8.5–10.5)
Chloride: 109 mMol/L (ref 98–110)
Creatinine: 0.81 mg/dL (ref 0.80–1.30)
EGFR: 98 mL/min/{1.73_m2} (ref 60–150)
Glucose: 101 mg/dL — ABNORMAL HIGH (ref 70–99)
Osmolality Calc: 284 mOsm/kg (ref 275–300)
Potassium: 3.7 mMol/L (ref 3.5–5.3)
Sodium: 142 mMol/L (ref 136–147)

## 2016-03-05 LAB — MAGNESIUM: Magnesium: 1.8 mg/dL (ref 1.6–2.6)

## 2016-03-05 LAB — PHOSPHORUS: Phosphorus: 3.2 mg/dL (ref 2.3–4.7)

## 2016-03-05 MED ORDER — MELOXICAM 7.5 MG PO TABS
15.0000 mg | ORAL_TABLET | Freq: Every day | ORAL | Status: DC
Start: 2016-03-05 — End: 2016-03-08
  Administered 2016-03-05 – 2016-03-08 (×4): 15 mg via ORAL
  Filled 2016-03-05 (×4): qty 2

## 2016-03-05 MED ORDER — TAMSULOSIN HCL 0.4 MG PO CAPS
0.4000 mg | ORAL_CAPSULE | Freq: Every morning | ORAL | Status: DC
Start: 2016-03-05 — End: 2016-03-08
  Administered 2016-03-05 – 2016-03-08 (×4): 0.4 mg via ORAL
  Filled 2016-03-05 (×4): qty 1

## 2016-03-05 MED ORDER — LORAZEPAM 1 MG PO TABS
1.0000 mg | ORAL_TABLET | Freq: Every day | ORAL | Status: DC | PRN
Start: 2016-03-05 — End: 2016-03-08
  Administered 2016-03-06: 1 mg via ORAL
  Filled 2016-03-05: qty 1

## 2016-03-05 MED ORDER — FINASTERIDE 5 MG PO TABS
5.0000 mg | ORAL_TABLET | Freq: Every day | ORAL | Status: DC
Start: 2016-03-05 — End: 2016-03-08
  Administered 2016-03-05 – 2016-03-08 (×4): 5 mg via ORAL
  Filled 2016-03-05 (×4): qty 1

## 2016-03-05 MED ORDER — TRAMADOL HCL 50 MG PO TABS
50.0000 mg | ORAL_TABLET | Freq: Three times a day (TID) | ORAL | Status: DC | PRN
Start: 2016-03-05 — End: 2016-03-08
  Administered 2016-03-06 – 2016-03-08 (×5): 50 mg via ORAL
  Filled 2016-03-05 (×5): qty 1

## 2016-03-05 MED ORDER — GABAPENTIN 400 MG PO CAPS
1200.0000 mg | ORAL_CAPSULE | Freq: Three times a day (TID) | ORAL | Status: DC
Start: 2016-03-05 — End: 2016-03-08
  Administered 2016-03-05 – 2016-03-08 (×11): 1200 mg via ORAL
  Filled 2016-03-05 (×13): qty 3

## 2016-03-05 MED ORDER — VH VANCOMYCIN THERAPY PLACEHOLDER
Status: DC
Start: 2016-03-05 — End: 2016-03-08

## 2016-03-05 MED ORDER — CITALOPRAM HYDROBROMIDE 20 MG PO TABS
20.0000 mg | ORAL_TABLET | Freq: Every day | ORAL | Status: DC
Start: 2016-03-05 — End: 2016-03-08
  Administered 2016-03-05 – 2016-03-08 (×4): 20 mg via ORAL
  Filled 2016-03-05 (×4): qty 1

## 2016-03-05 MED ORDER — VH VANCOMYCIN 1250 MG IN NS 250 ML (SIMPLE)
1250.0000 mg | Freq: Two times a day (BID) | Status: DC
Start: 2016-03-05 — End: 2016-03-07
  Administered 2016-03-05 – 2016-03-07 (×5): 1250 mg via INTRAVENOUS
  Filled 2016-03-05 (×5): qty 250

## 2016-03-05 NOTE — Telephone Encounter (Signed)
Patient is aware 

## 2016-03-05 NOTE — Telephone Encounter (Signed)
Fasting labs ordered

## 2016-03-05 NOTE — Progress Notes (Signed)
[facility]   [address]   [place] [address]     INITIAL ASSESSMENT  Case Management       Estimated D/C Date:     6/3   RX Coverage:       Yes   Inpatient Plan of Care:      Admitted to hospital with RLL cellulitis and abscess related to bug bite; IV vanc/rocephin; lyme serology cultures pending   CM Interventions:      Electronic medical record reviewed; case discussed with nursing. Patient and spouse traveling in RV; independent prior to admission-no anticipated [place]/cm needs          03/05/16 1545   Patient Type   Within 30 Days of Previous Admission? No   Medicare focused diagnosis patient? Not a Medicare focused diagnosis patient   Bundle patient? Not a bundle patient   Healthcare Decisions   Interviewed: Patient   Orientation/Decision Making Abilities of Patient Alert and Oriented x3, able to make decisions   Advance Directive Patient does not have advance directive   Prior to admission   Prior level of function Independent with ADLs;Ambulates independently   Type of Residence Other (Comment)  (RV)   Home Layout One level   Have running water, electricity, heat, etc? Yes   Living Arrangements Spouse/significant other   How do you get to your MD appointments? Spouse/Self   How do you get your groceries? Spouse/Self   Who fixes your meals? Spouse/Self   Who does your laundry? Self/Spouse   Who picks up your prescriptions? Self/Spouse   Dressing Independent   Grooming Independent   Feeding Independent   Bathing Independent   Toileting Independent   Adult Protective Services (APS) involved? No   Discharge Planning   Support Systems Spouse/significant other;Children   Anticipated [place] plan discussed with: Same as interviewed   Mode of transportation: Private car (family member)   Consults/Providers   PT Evaluation Needed 2   OT Evalulation Needed 2   SLP Evaluation Needed 2   Correct PCP listed in Epic? (Has PCP in [place], [place])       Monna Fam, RN, BSN, ACM  Case Manager  [facility]  [phone]

## 2016-03-05 NOTE — Progress Notes (Signed)
Nutrition Therapy  Nutrition Assessment    Patient Information:     Name:Landrum Devell Parkerson.   Age: 60 y.o.   Sex: male     MRN: [id number]    Recommendation:     1. Continue regular diet    Nutrition History:   Weight loss screen  Pt adm with fever and right LE cellulitis believed to be secondary to a bug bite. States weight loss was intentional, appetite good. Tolerating po diet, ate 100% of breakfast this morning. No needs voiced at this time.    Nutrition Risk Level: Low    Nutrition Diagnosis:     None at this time     Monitoring:  Evaluation:    PO/EN/PN intake:  Amount of food , General healthful diet and Liquid meal replacement, or supplement intake   Labs:  Electrolyte Profile   GI Profile:  Bowel Function   Nutrition Focused Physical:  Overall appearance and Digestive system       Assessment Data:     Admission Dx:  Fever and chills [R50.9]  Cellulitis of right leg [L03.115]  Tick bites, initial encounter [V40.XXXA]  PMH:  has a past medical history of Extruding scleral buckle; BPH (benign prostatic hyperplasia); Depression; and Lumbar herniated disc.  PSH:  has no past surgical history on file.     Height: 1.88 m (6' 2.02")   Weight: 89.7 kg (197 lb 12 oz)   BMI: Body mass index is 25.38 kg/(m^2).   IBW: 86 kg    Pertinent Meds: reviewed  Pertinent Labs:    Recent Labs  Lab 03/05/16  0603 03/04/16  1940   SODIUM 142 135*   POTASSIUM 3.7 3.6   CHLORIDE 109 101   CO2 27.6 26.8   BUN 16 20   CREATININE 0.81 1.03   GLUCOSE 101* 102*   CALCIUM 8.6 8.6   MAGNESIUM 1.8  --    PHOSPHORUS 3.2  --       Diet Order:  Orders Placed This Encounter   Procedures   . Diet regular      GI symptoms:    bm pta  Skin: no PI noted    Learning Needs:  No education needs at this time      Additional Comments:       Herma Ard, RD, MA  03/05/2016 10:07 AM

## 2016-03-05 NOTE — Progress Notes (Signed)
Pharmacy Vancomycin Dosing Consult Note  Lori Liew Penne Lash.    Assessment:   1. Vancomycin day 1 (+ceftriaxone) for LRE cellulitis in a 89 YOM who presented with left leg pain, swelling and erythema for the last three days with new onset fever today.  2. Renal function is WNL; leukocytosis at admission; patient is currently afebrile; wound and blood cultures have been sent.  Patient was given vancomycin 1000mg  IV once in the ED.     Plan:   1. Start vancomycin 1250mg  IV Q12H.  2. Trough scheduled for 03/07/16@0700 .  3. Pharmacy will follow the patient's renal function, vancomycin levels, and dosing during the course of therapy. If you have any questions, please contact the pharmacist at [phone].     Indication: cellulitis    Age: 60 y.o.  Height: 1.88 m (6' 2.02")  Weight:  89.7 kg (197 lb 12 oz)  IBW: 82.2 kg  DW: 89.7 kg    CrCL: 90 ml/min    Other Nephrotoxic Drugs:     Drug Levels:   Trough level: scheduled     Pharmacokinetics:   t50: 9 hrs         Kel: 0.079 hr-1        Vd: 63 L         Expected Trough: 13 mg/L                Recent Labs  Lab 03/04/16  1940   CREATININE 1.03   BUN 20       Recent Labs  Lab 03/04/16  1940   WBC 12.0*     Temp (24hrs), Avg:99.5 F (37.5 C), Min:98.6 F (37 C), Max:101.1 F (38.4 C)      Cultures:   Date Source Organism Sensitivities Resistance

## 2016-03-05 NOTE — UM Notes (Addendum)
INPATIENT ADMISSION  [facility]      PATIENT:   Vincent Vega  DOB [birth year]      TRICARE ID # [id number]    IP REF # [id number]    Requesting authorization for inpatient admission      Please call Clide Deutscher, RN with any questions  Phone [phone]  Fax [phone]  Thank you            [facility] Utilization Management Review Sheet    NAME: Vincent Vega.  MR#: [id number]    CSN#: [id number]    ROOM: 562/562-A AGE: 60 y.o.    ADMIT DATE AND TIME: 03/04/2016  7:18 PM      PATIENT CLASS: Inpatient status    ATTENDING PHYSICIAN: Stann Ore, MD  PAYOR:Payor: TRICARE / Plan: TRICARE [place]TH STANDARD / Product Type: *No Product type* /         DIAGNOSIS:     ICD-10-CM    1. Fever and chills R50.9    2. Cellulitis of right leg L03.115    3. Tick bites, initial encounter W57.XXXA        HISTORY:   Past Medical History   Diagnosis Date   . Extruding scleral buckle    . BPH (benign prostatic hyperplasia)    . Depression    . Lumbar herniated disc        DATE OF REVIEW: 03/05/2016    VITALS: BP 113/64 mmHg  Pulse 76  Temp(Src) 99.2 F (37.3 C) (Oral)  Resp 20  Ht 1.88 m (6' 2.02")  Wt 89.7 kg (197 lb 12 oz)  BMI 25.38 kg/m2  SpO2 98%        UR NOTE:  ER admit  Inpatient status      Per MD H&P note:  Chief complaint : Fever and leg cellulitis  60 year old male with past medical history of BPH, depression and chronic back pain came to the emergency room today for increasing redness, swelling and pain in right lower extremity and high-grade fever since Monday. Patient lives in a RV and spends most of the time outside. Patient has frequent tick and bug bites. Patient had some unknown bug bite to his right calf and lower thigh area 1 week ago. Patient noticed redness and swelling since Sunday, which has been progressively getting worse. Patient then started having high-grade fever up to 102.4 and has been taking Tylenol. Patient had doxycycline, which he started taking since yesterday. Today patient  drove down to this area, and decided to seek medical attention. In the emergency room patient is noted to have cellulitis of the right calf extending up to the posterior lower thigh. 2 bug bites with surrounding induration noted, but no obvious fluctuance or abscess seen. In the emergency room patient had wound culture sent. Patient received IV vancomycin and ceftriaxone and recommended admission for IV antibiotics. Presently patient is resting comfortably but complains of pain with touch and movement of the right knee but no joint pain.    Assessment and Plan:     #Right lower extremity cellulitis secondary to bug bite.  We'll continue patient on IV vancomycin and Zosyn for now.  Follow-up on wound culture.  We'll check Lyme serology.  Consider ID consult.    #Fever, likely secondary to above.  Continue current management.  Lactic acid level is 0.79.  Follow up on blood culture.    #History of BPH  Continue home dose of Flomax and Proscar.    #History of chronic  back pain.  Continue when necessary tramadol, Percocet and morphine.    #History of anxiety and depression.  Continue Celexa and when necessary Ativan.     #History of peripheral neuropathy  Continue home dose of Neurontin.                 VS: temp 101.1        LABS;  WBC 12.0  Na+ 135  Blood cultures pending  Wound culture pending      MEDS:  IV Rocephin daily  IV Vanco Q12H      MEDS in ER:  Tylenol 1,000 mg po x 1  IV rocephin x 1  IV Toradol 30 mg x 1  IVF NS Bolus x 1 liter  IV Vanco x 1      Per ER MD note:  Skin: Skin is dry. Rash (hard tense cellulitis with hot red skin to the right lateral thigh and down the calf. small bites, one in the middle with a tiny blister on it and crusting. no homans does not seem to be a DVT) noted. There is erythema    Wound culture obtained by me, popped small blister and was able to get a little dot of pus.                Meets inpatient criteria under M-70  Per MD note patient took doxycycline with no s/s  improvement  Extends from calf up to thigh      Please call Clide Deutscher, RN with any questions  Phone [phone]  Fax [phone]  Thank you              Vincent Vega, Vincent Vega. #[id number] (CSN: [id number]) (60 y.o. M) (Adm: 03/04/16)     [facility] [id number]            PCP     PCP, Octaviano Glow      Demographics  Comment        Last edited by  on at     Address: Home Phone: Work Civil engineer, contracting:    [address]   [place] [address]    [phone] -- --    SSN: Insurance: Marital Status: Religion:    [phone] TR[employer]ARE Married [employer] Public relations account executive to Patient     Power of Attorney Living Will Code Status MyChart Status    Not on File Not on File FULL [Updated on 03/04/16 2345] Inactive      ADT Event (#EPT)      Unit Room Bed Event           Hospital Service Patient Class Level of Care Accommodation Code           Accommodation Reason Last updated by Last updated on Linked event(s)             Auth/Cert Information      Open Auth/Cert for Hospital Account [id number]      Admission Information     Attending Provider Admitting Provider Admission Type Admission Date/Time    Stann Ore, MD Brantley Persons, MD Emergency 03/04/16 1918    Discharge Date Hospital Service Auth/Cert Status Service Area     Medicine Incomplete SA 510    Unit Room/Bed Admission Status    [facility] PULMONARY RENAL 562/562-A Admission (Confirmed)            Hospital Account     Name Acct ID Class Status Primary Coverage  Vincent Vega, Vincent Vega [id number] Inpatient Open TRICARE - TRICARE [place]TH STANDARD            Guarantor Account (for Hospital Account [id number])     Name Relation to Pt Service Area Active? Acct Type    Vincent Vega. Self [facility] Yes Personal/Family    Address Phone          [address]  [place], [place] [address] [phone](H)              Coverage Information (for Hospital Account [id number])     F/O Payor/Plan Precert  #    [facility] STANDARD     Subscriber Subscriber #    Vincent Vega, Vincent Vega [id number]    Address Phone    [id number]  [place], [place] [address]

## 2016-03-05 NOTE — Plan of Care (Signed)
Patient and wife oriented to room use of call bell., policies and procedures, bed locked in lowest position, instructed to call for assist secondary to multiple falls today

## 2016-03-05 NOTE — Progress Notes (Signed)
Patient: Vincent Vega.  Date: 03/05/2016   LOS: 1 Days  Admission Date: 03/04/2016   MRN: [id number]  Attending: Stann Ore, MD     SUBJECTIVE     Patient seen and examined still with right calf erythema      MEDICATIONS     Current Facility-Administered Medications   Medication Dose Route Frequency   . cefTRIAXone  2 g Intravenous Q24H   . citalopram  20 mg Oral Daily   . enoxaparin  40 mg Subcutaneous Q24H   . finasteride  5 mg Oral Daily   . gabapentin  1,200 mg Oral TID   . lactobacillus species  50 Billion CFU Oral Daily   . meloxicam  15 mg Oral Daily   . tamsulosin  0.4 mg Oral QAM   . vancomycin  1,250 mg Intravenous Q12H   . vancomycin therapy placeholder   Does not apply See Admin Instructions              PHYSICAL EXAM     Objective:        Filed Vitals:    03/04/16 2216 03/04/16 2301 03/04/16 2350 03/05/16 0754   BP: 110/70 112/64 102/68 113/64   Pulse: 78 75 75 76   Temp: 98.7 F (37.1 C)  98.6 F (37 C) 99.2 F (37.3 C)   TempSrc: Oral  Oral Oral   Resp: 17 22 20 20    Height:   1.88 m (6' 2.02")    Weight:   89.7 kg (197 lb 12 oz)    SpO2: 95% 98% 99% 98%     Wt Readings from Last 4 Encounters:   03/04/16 89.7 kg (197 lb 12 oz)       Exam: Patient is awake, alert and oriented x 3  HEENT: PERRLA, EOMI  Neck: supple, without JVD  Chest: CTA bilaterally. No crackles or wheezing.  CVS: S1, S2 regular rate and rhythm  no murmurs    Abdomen: soft and non tender with good bowel sounds.  Musculoskeletal: Right calf erythema with a superimposed abscess  Skin: Warm dry no worrisome lesions  NEURO:  Cranial nerve II-12 intact no motor or sensory deficits  Psychiatric: alert, interactive, appropriate, normal affect      LABS       Recent Labs  Lab 03/05/16  0603 03/04/16  1940   WBC 8.7 12.0*   RBC 3.46* 3.68*   HEMOGLOBIN 11.3* 11.9*   HEMATOCRIT 34.7* 36.2*   MCV 100 99   PLT CT 152 162       Recent Labs  Lab 03/05/16  0603  03/04/16  1940   SODIUM 142 135*   POTASSIUM 3.7 3.6   CHLORIDE 109 101   CO2 27.6 26.8   BUN 16 20   CREATININE 0.81 1.03   GLUCOSE 101* 102*   CALCIUM 8.6 8.6   MAGNESIUM 1.8  --                Invalid input(s): OSMOL    Recent Labs  Lab 03/04/16  1940   ALT 49   AST (SGOT) 47*   BILIRUBIN, TOTAL 0.7   BILIRUBIN, DIRECT 0.3   ALBUMIN 3.2*   ALKALINE PHOSPHATASE 67       Recent Labs  Lab 03/04/16  1940   CREATINE KINASE (CK) 98     No results found for: BNP          Invalid input(s): BLOODCULTURE      RADIOLOGY  No results found.      Assessment and Plan:       - Right lower extremity cellulitis and abscess, now afebrile will continue with IV vancomycin and monitor vancomycin and monitor renal function continue with Rocephin and follow culture results follow-up Lyme serology sent out from the emergency department also appears unlikely.    - Chronic back pain on meloxicam at home to be continued closely monitor renal function.    - History of BPH with no evidence of urinary retention on Flomax and Proscar.    - Depression on Celexa    - Leukocytosis resolved    - DVT prophylaxis            - Disposition- clinical improvement expected 48 hours no  Home-going needs     Stann Ore, MD  03/05/2016 10:33 AM    I can be reached at [phone]    NOTE: This record is partially electronically generated (including the use of voice recognition technology) and may contain additions and omissions unintended by user.

## 2016-03-05 NOTE — Plan of Care (Signed)
Patient in room with wife at bedside, awake, a/o, skin hot to touch. Area to RLE erythematic and area has increased in size, outline with skin marker, continues with IV antibiotics, awaiting results

## 2016-03-06 ENCOUNTER — Ambulatory Visit: Admitting: Internal Medicine

## 2016-03-06 ENCOUNTER — Other Ambulatory Visit: Payer: Self-pay | Admitting: Sports Medicine

## 2016-03-06 LAB — LYME AB, TOTAL,REFLEX TO WESTERN BLOT (IGG & IGM)
Lyme AB: NONREACTIVE
Lyme AB: NONREACTIVE

## 2016-03-06 MED ORDER — ZOLPIDEM TARTRATE 5 MG PO TABS
5.0000 mg | ORAL_TABLET | Freq: Every evening | ORAL | Status: DC | PRN
Start: 2016-03-06 — End: 2016-03-08
  Administered 2016-03-06 – 2016-03-07 (×2): 5 mg via ORAL
  Filled 2016-03-06 (×2): qty 1

## 2016-03-06 NOTE — Progress Notes (Signed)
Pharmacy Vancomycin Dosing Consult Note  Vincent Vega.    Assessment:   1. Day # 2 Vancomycin for RLE cellulitis/abscess associated with bug bite.  Rocephin discontinued today.  Renal function stable, WBC WNL (leukocytosis resolved), afebrile.  RN notes yesterday evening area remains erythematic and has increased in size. Wound culture is + for MRSA.  No babesia seen in blood smear.       Plan:   1. Continue current regimen of Vancomycin 1250 mg IV q12h.    2. Pharmacy will follow the patient's renal function, vancomycin levels, and dosing during the course of therapy. If you have any questions, please contact the pharmacist at [phone].     Indication: cellulitis    Age: 60 y.o.  Height: 1.88 m (6' 2.02")  Weight:  89.7 kg (197 lb 12 oz)  IBW: 82.2 kg  DW: N/A    CrCL: 116 ml/min    Other Nephrotoxic Drugs: gabapentin    Drug Levels:   Trough level: due 6/3 at 0700       Recent Labs  Lab 03/05/16  0603 03/04/16  1940   CREATININE 0.81 1.03   BUN 16 20       Recent Labs  Lab 03/05/16  0603 03/04/16  1940   WBC 8.7 12.0*     Temp (24hrs), Avg:98.5 F (36.9 C), Min:98.2 F (36.8 C), Max:98.8 F (37.1 C)      Cultures:   Date Source Organism Sensitivities Resistance   5/31 Leg wound MRSA CD, LNZ, RIF, E. T/S, V OX   5/31 Blood- in progress

## 2016-03-06 NOTE — Progress Notes (Signed)
03/06/16 1345   Case Management Quick Doc   Case Management Assessment Status Assessment Complete   CM Comments 6/2 CM: Wound culture +MRSA; contact isolation; IV vanc; [place] rocephin; cellulitis improving; not anticipating [place]/cm needs at this time.   Expected Discharge Date 03/07/16   Physical Discharge Disposition Home, No Needs   Discharge Disposition   Mode of Transportation Car       Monna Fam, RN, BSN, [place]  Case Manager  [facility]  [phone]

## 2016-03-06 NOTE — Progress Notes (Signed)
Patient: Vincent Vega.  Date: 03/06/2016   LOS: 2 Days  Admission Date: 03/04/2016   MRN: [id number]  Attending: Stann Ore, MD     SUBJECTIVE     Patient seen and examined of erythema persists complaining of headaches today      MEDICATIONS     Current Facility-Administered Medications   Medication Dose Route Frequency   . citalopram  20 mg Oral Daily   . enoxaparin  40 mg Subcutaneous Q24H   . finasteride  5 mg Oral Daily   . gabapentin  1,200 mg Oral TID   . lactobacillus species  50 Billion CFU Oral Daily   . meloxicam  15 mg Oral Daily   . tamsulosin  0.4 mg Oral QAM   . vancomycin  1,250 mg Intravenous Q12H   . vancomycin therapy placeholder   Does not apply See Admin Instructions              PHYSICAL EXAM     Objective:  I/O last 3 completed shifts:  In: 370 [P.O.:370]  Out: 0      Filed Vitals:    03/05/16 0754 03/05/16 1551 03/05/16 2339 03/06/16 0722   BP: 113/64 101/64 106/61 117/73   Pulse: 76 75 80 78   Temp: 99.2 F (37.3 C) 98.8 F (37.1 C) 98.4 F (36.9 C) 98.2 F (36.8 C)   TempSrc: Oral Oral Oral Oral   Resp: 20 19 20 20    Height:       Weight:       SpO2: 98% 94% 99% 97%     Wt Readings from Last 4 Encounters:   03/04/16 89.7 kg (197 lb 12 oz)       Exam: Patient is awake, alert and oriented x 3  HEENT: PERRLA, EOMI  Neck: supple, without JVD  Chest: CTA bilaterally. No crackles or wheezing.  CVS: S1, S2 regular rate and rhythm  no murmurs    Abdomen: soft and non tender with good bowel sounds.  Musculoskeletal: Right calf erythema with a superimposed abscess  Skin: Warm dry no worrisome lesions  NEURO:  Cranial nerve II-12 intact no motor or sensory deficits  Psychiatric: alert, interactive, appropriate, normal affect      LABS       Recent Labs  Lab 03/05/16  0603 03/04/16  1940   WBC 8.7 12.0*   RBC 3.46* 3.68*   HEMOGLOBIN 11.3* 11.9*   HEMATOCRIT 34.7* 36.2*   MCV 100 99   PLT CT 152 162       Recent Labs  Lab  03/05/16  0603 03/04/16  1940   SODIUM 142 135*   POTASSIUM 3.7 3.6   CHLORIDE 109 101   CO2 27.6 26.8   BUN 16 20   CREATININE 0.81 1.03   GLUCOSE 101* 102*   CALCIUM 8.6 8.6   MAGNESIUM 1.8  --                Invalid input(s): OSMOL    Recent Labs  Lab 03/04/16  1940   ALT 49   AST (SGOT) 47*   BILIRUBIN, TOTAL 0.7   BILIRUBIN, DIRECT 0.3   ALBUMIN 3.2*   ALKALINE PHOSPHATASE 67       Recent Labs  Lab 03/04/16  1940   CREATINE KINASE (CK) 98     No results found for: BNP          Invalid input(s): BLOODCULTURE      RADIOLOGY  No results found.      Assessment and Plan:       - MRSA Right lower extremity cellulitis and abscess, now afebrile will continue with IV vancomycin and monitor vancomycin and monitor renal function discontinue Rocephin  follow-up Lyme serology sent out from the emergency department also appears unlikely.    - Chronic back pain on meloxicam at home to be continued closely monitor renal function.    - Headaches probably caffeine withdrawal    - History of BPH with no evidence of urinary retention on Flomax and Proscar.    - Depression on Celexa    - Leukocytosis resolved    - DVT prophylaxis    - Disposition- clinical improvement      Stann Ore, MD  03/06/2016 10:28 AM    I can be reached at [phone]    NOTE: This record is partially electronically generated (including the use of voice recognition technology) and may contain additions and omissions unintended by user.

## 2016-03-07 LAB — BASIC METABOLIC PANEL
Anion Gap: 10.1 mMol/L (ref 7.0–18.0)
BUN / Creatinine Ratio: 15.1 Ratio (ref 10.0–30.0)
BUN: 11 mg/dL (ref 7–22)
CO2: 27.9 mMol/L (ref 20.0–30.0)
Calcium: 8.5 mg/dL (ref 8.5–10.5)
Chloride: 105 mMol/L (ref 98–110)
Creatinine: 0.73 mg/dL — ABNORMAL LOW (ref 0.80–1.30)
EGFR: 102 mL/min/{1.73_m2} (ref 60–150)
Glucose: 98 mg/dL (ref 70–99)
Osmolality Calc: 277 mOsm/kg (ref 275–300)
Potassium: 4 mMol/L (ref 3.5–5.3)
Sodium: 139 mMol/L (ref 136–147)

## 2016-03-07 LAB — CBC AND DIFFERENTIAL
Basophils %: 0.7 % (ref 0.0–3.0)
Basophils Absolute: 0 10*3/uL (ref 0.0–0.3)
Eosinophils %: 10 % — ABNORMAL HIGH (ref 0.0–7.0)
Eosinophils Absolute: 0.6 10*3/uL (ref 0.0–0.8)
Hematocrit: 33.9 % — ABNORMAL LOW (ref 39.0–52.5)
Hemoglobin: 11.2 gm/dL — ABNORMAL LOW (ref 13.0–17.5)
Lymphocytes Absolute: 1.4 10*3/uL (ref 0.6–5.1)
Lymphocytes: 21.5 % (ref 15.0–46.0)
MCH: 33 pg (ref 28–35)
MCHC: 33 gm/dL (ref 32–36)
MCV: 99 fL (ref 80–100)
MPV: 8.5 fL (ref 6.0–10.0)
Monocytes Absolute: 0.8 10*3/uL (ref 0.1–1.7)
Monocytes: 12.5 % (ref 3.0–15.0)
Neutrophils %: 55.3 % (ref 42.0–78.0)
Neutrophils Absolute: 3.5 10*3/uL (ref 1.7–8.6)
PLT CT: 176 10*3/uL (ref 130–440)
RBC: 3.43 10*6/uL — ABNORMAL LOW (ref 4.00–5.70)
RDW: 11.2 % (ref 11.0–14.0)
WBC: 6.3 10*3/uL (ref 4.0–11.0)

## 2016-03-07 LAB — HEPATIC FUNCTION PANEL
ALT: 64 U/L — ABNORMAL HIGH (ref 0–55)
AST (SGOT): 32 U/L (ref 10–42)
Albumin/Globulin Ratio: 0.84 Ratio (ref 0.70–1.50)
Albumin: 2.7 gm/dL — ABNORMAL LOW (ref 3.5–5.0)
Alkaline Phosphatase: 73 U/L (ref 40–145)
Bilirubin Direct: 0.2 mg/dL (ref 0.0–0.3)
Bilirubin, Total: 0.6 mg/dL (ref 0.1–1.2)
Globulin: 3.2 gm/dL (ref 2.0–4.0)
Protein, Total: 5.9 gm/dL — ABNORMAL LOW (ref 6.0–8.3)

## 2016-03-07 LAB — VANCOMYCIN, TROUGH: Vancomycin Trough: 7.79 ug/mL — ABNORMAL LOW (ref 10.00–20.00)

## 2016-03-07 MED ORDER — VANCOMYCIN HCL 500 MG IV SOLR
500.0000 mg | Freq: Once | INTRAVENOUS | Status: AC
Start: 2016-03-07 — End: 2016-03-07
  Administered 2016-03-07: 500 mg via INTRAVENOUS
  Filled 2016-03-07: qty 500
  Filled 2016-03-07: qty 100

## 2016-03-07 MED ORDER — VH VANCOMYCIN 1500 MG IN NS 500 ML (SIMPLE)
1500.0000 mg | Freq: Two times a day (BID) | INTRAVENOUS | Status: DC
Start: 2016-03-07 — End: 2016-03-08
  Administered 2016-03-07 – 2016-03-08 (×2): 1500 mg via INTRAVENOUS
  Filled 2016-03-07 (×3): qty 500

## 2016-03-07 NOTE — Progress Notes (Signed)
Patient: Vincent Vega.  Date: 03/07/2016   LOS: 3 Days  Admission Date: 03/04/2016   MRN: [id number]  Attending: Stann Ore, MD     SUBJECTIVE     Patient seen and examined of erythema persists headache resolved      MEDICATIONS     Current Facility-Administered Medications   Medication Dose Route Frequency   . citalopram  20 mg Oral Daily   . enoxaparin  40 mg Subcutaneous Q24H   . finasteride  5 mg Oral Daily   . gabapentin  1,200 mg Oral TID   . lactobacillus species  50 Billion CFU Oral Daily   . meloxicam  15 mg Oral Daily   . tamsulosin  0.4 mg Oral QAM   . vancomycin  1,500 mg Intravenous Q12H   . vancomycin  500 mg Intravenous Once   . vancomycin therapy placeholder   Does not apply See Admin Instructions              PHYSICAL EXAM     Objective:  I/O last 3 completed shifts:  In: 1710 [P.O.:1710]  Out: -      Filed Vitals:    03/05/16 2339 03/06/16 0722 03/06/16 2210 03/07/16 0843   BP: 106/61 117/73 111/68 112/62   Pulse: 80 78 78 71   Temp: 98.4 F (36.9 C) 98.2 F (36.8 C) 99.4 F (37.4 C) 98.5 F (36.9 C)   TempSrc: Oral Oral Oral Oral   Resp: 20 20 20 20    Height:       Weight:       SpO2: 99% 97% 98%      Wt Readings from Last 4 Encounters:   03/04/16 89.7 kg (197 lb 12 oz)       Exam: Patient is awake, alert and oriented x 3  HEENT: PERRLA, EOMI  Neck: supple, without JVD  Chest: CTA bilaterally. No crackles or wheezing.  CVS: S1, S2 regular rate and rhythm  no murmurs    Abdomen: soft and non tender with good bowel sounds.  Musculoskeletal: Right calf erythema with superimposed abscess now collecting  Skin: Warm dry no worrisome lesions  NEURO:  Cranial nerve II-12 intact no motor or sensory deficits  Psychiatric: alert, interactive, appropriate, normal affect      LABS       Recent Labs  Lab 03/07/16  0536 03/05/16  0603 03/04/16  1940   WBC 6.3 8.7 12.0*   RBC 3.43* 3.46* 3.68*   HEMOGLOBIN 11.2* 11.3* 11.9*      HEMATOCRIT 33.9* 34.7* 36.2*   MCV 99 100 99   PLT CT 176 152 162       Recent Labs  Lab 03/07/16  0536 03/05/16  0603 03/04/16  1940   SODIUM 139 142 135*   POTASSIUM 4.0 3.7 3.6   CHLORIDE 105 109 101   CO2 27.9 27.6 26.8   BUN 11 16 20    CREATININE 0.73* 0.81 1.03   GLUCOSE 98 101* 102*   CALCIUM 8.5 8.6 8.6   MAGNESIUM  --  1.8  --                Invalid input(s): OSMOL    Recent Labs  Lab 03/07/16  0536 03/04/16  1940   ALT 64* 49   AST (SGOT) 32 47*   BILIRUBIN, TOTAL 0.6 0.7   BILIRUBIN, DIRECT 0.2 0.3   ALBUMIN 2.7* 3.2*   ALKALINE PHOSPHATASE 73 67  Recent Labs  Lab 03/04/16  1940   CREATINE KINASE (CK) 98     No results found for: BNP          Invalid input(s): BLOODCULTURE      RADIOLOGY     No results found.      Assessment and Plan:       - MRSA Right lower extremity cellulitis and abscess, continue with IV vancomycin vancomycin tough noted to be only 7.0.  an additional 500 mg IV of vancomycin and subsequently dose increased to 1500 twice a day. We'll monitor with a recheck of vancomycin trough. Monitor renal function. Lyme serology is unremarkable.    - Chronic back pain on meloxicam at home to be continued closely monitor renal function.    - Headaches probably caffeine withdrawal    - History of BPH with no evidence of urinary retention on Flomax and Proscar.    - Depression on Celexa    - Leukocytosis resolved    - DVT prophylaxis    - Disposition- clinical improvement      Stann Ore, MD  03/07/2016 10:13 AM    I can be reached at [phone]    NOTE: This record is partially electronically generated (including the use of voice recognition technology) and may contain additions and omissions unintended by user.

## 2016-03-07 NOTE — Progress Notes (Signed)
Pharmacy Vancomycin Dosing Consult Note  Vincent Vega.    Assessment:   1. Day # 3 Vancomycin for RLE MRSA cellulitis/abscess associated with bug bite.  2. Renal function stable, WBC WNL (leukocytosis resolved), afebrile.  Wound posotive for MRSA. Blood NGTD.  3. Trough on 6/3 was subtherapeutic at 7.8 mcg/ml on 1250 mg Q12H. Prior to the 6th dose. Drawn 90 minutes early.      Plan:   1. Increase vancomycin to 1500 mg every 12 hours. Give a one time dose of 500mg  to supplement the 1250 mg dose. Re-check trough on 6/5 @ 1900. Goal 10-15 mcg/ml.    2. Pharmacy will follow the patient's renal function, vancomycin levels, and dosing during the course of therapy. If you have any questions, please contact the pharmacist at [phone].     Indication: cellulitis    Age: 60 y.o.  Height: 1.88 m (6' 2.02")  Weight:  89.7 kg (197 lb 12 oz)  IBW: 82.2 kg  DW: N/A    CrCL: 128 ml/min    Other Nephrotoxic Drugs:     Drug Levels:   Trough level: 7.8 mcg/ml on 03/07/16 (1250 mg Q12H - drawn 90 min early, prior to the 6th dose)       Recent Labs  Lab 03/07/16  0536 03/05/16  0603 03/04/16  1940   CREATININE 0.73* 0.81 1.03   BUN 11 16 20        Recent Labs  Lab 03/07/16  0536 03/05/16  0603 03/04/16  1940   WBC 6.3 8.7 12.0*     Temp (24hrs), Avg:99.4 F (37.4 C), Min:99.4 F (37.4 C), Max:99.4 F (37.4 C)      Cultures:   Date Source Organism Sensitivities Resistance   5/31 Leg wound MRSA CD, LNZ, RIF, E. T/S, V OX   5/31 Blood- in progress

## 2016-03-07 NOTE — Plan of Care (Signed)
Problem: Pain  Goal: Patient's pain/discomfort is manageable  Outcome: Progressing  Will continue to assess pain level. Pt requests for pain medication once this shift. Stated that there is some tenderness around the site of the bite on his RLE. No needs at this time, will continue to assess.

## 2016-03-08 DIAGNOSIS — L02415 Cutaneous abscess of right lower limb: Secondary | ICD-10-CM

## 2016-03-08 DIAGNOSIS — B9562 Methicillin resistant Staphylococcus aureus infection as the cause of diseases classified elsewhere: Secondary | ICD-10-CM

## 2016-03-08 MED ORDER — LIDOCAINE HCL 1 % IJ SOLN
10.0000 mL | Freq: Once | INTRAMUSCULAR | Status: AC
Start: 2016-03-08 — End: 2016-03-08
  Administered 2016-03-08: 10 mL via INTRADERMAL
  Filled 2016-03-08: qty 10

## 2016-03-08 MED ORDER — OXYCODONE-ACETAMINOPHEN 5-325 MG PO TABS
1.0000 | ORAL_TABLET | Freq: Four times a day (QID) | ORAL | Status: DC | PRN
Start: 2016-03-08 — End: 2018-01-20

## 2016-03-08 MED ORDER — SULFAMETHOXAZOLE-TRIMETHOPRIM 800-160 MG PO TABS
1.0000 | ORAL_TABLET | Freq: Two times a day (BID) | ORAL | Status: AC
Start: 2016-03-08 — End: 2016-03-18

## 2016-03-08 MED ORDER — MUPIROCIN CALCIUM 2 % NA OINT
TOPICAL_OINTMENT | NASAL | Status: AC
Start: 2016-03-08 — End: ?

## 2016-03-08 NOTE — Consults (Signed)
CONSULTATION - ACCESS   Name:  Vincent Vega, Vincent Vega.                 MR#:  [id number]               Date:  03/08/2016       Requesting Physician:  Dr. Thedore Mins (received call from physician)  Reason for Consultation:  Right leg abscess    IMPRESSION:   60 y.o. male  with <principal problem not specified>.  Right leg cellulitis and abscess    Active Hospital Problems    Diagnosis   . Cellulitis and abscess of leg     Nontoxic.  Being treated for MRSA.   RECOMMENDATIONS:   Bedside I&D.     Leory Plowman, MD    X [phone]  ACCESS [phone] or [phone]      CC:  Patient complains of right leg pain.      HPI:   60 y.o. male  presents with a complaint of right pain located in the lower leg/calf area.  The pain is described as sharp, moderate, and has been constant.  Onset was 2 weeks ago, after apparent tick/bug bite.  He was hospitalized on 03/04/16 and treated with antibiotics (MRSA +).  Symptoms have been gradually improving since then, including swelling and redness.  Aggravating factors include activity, movement and pressure.  Alleviating factors include opioids.  Associated symptoms include none.  The patient denies chills, constipation, diarrhea, dysuria, fever, hematochezia, hematuria, melena, nausea, sweats and vomiting.  The pain does not radiate.    PMH:   He  has a past medical history of Extruding scleral buckle; BPH (benign prostatic hyperplasia); Depression; and Lumbar herniated disc.     PSH:   He  has no past surgical history on file.     Social History:   He  reports that he has never smoked. He does not have any smokeless tobacco history on file. He reports that he drinks alcohol. He reports that he does not use illicit drugs.    Family History:   His family history is not on file.       Home Medications:     No outpatient prescriptions have been marked as taking for the 03/04/16 encounter [facility] Encounter).       Hospital Medications:     citalopram 20 mg Oral Daily   enoxaparin 40 mg Subcutaneous Q24H    finasteride 5 mg Oral Daily   gabapentin 1,200 mg Oral TID   lactobacillus species 50 Billion CFU Oral Daily   lidocaine 10 mL Intradermal Once   meloxicam 15 mg Oral Daily   tamsulosin 0.4 mg Oral QAM   vancomycin 1,500 mg Intravenous Q12H   vancomycin therapy placeholder  Does not apply See Admin Instructions        Allergies:   He has No Known Allergies.     ROS(10 of 14):   Pertinent details as detailed in HPI.      Extremity:  Right leg pain/redness improved, drainage from wound  All other systems were reviewed and are negative.      Physical Exam(8 of 12):   Blood pressure 96/54, pulse 65, temperature 97.6 F (36.4 C), temperature source Oral, resp. rate 18, height 1.88 m (6' 2.02"), weight 89.7 kg (197 lb 12 oz), SpO2 98 %.   General: well-nourished, in no apparent distress, non-toxic  Extremities: right posterior leg (calf) with small draining wound.  Erythema and edema of surrounding tissue noted (  though improved based on previous markings showing larger involved area    Labs:    Lab results have been reviewed as follows:     ISO #1              **Methicillin              Resistant              Staphylococcus aureus*              *              ---------------------  MIC (mcg/ml)   Clindamycin (CD)   <=0.25      S   Linezolid (LNZ)    2        S   Nafcillin (OX)    >=4       R   Rifampin (RIF)    <=0.5      S   Tetracycline (TE)   <=1       S   Trimeth/Sulfa (T/S)  <=10       S   Vancomycin (V)    1        S  Radiology:   No results found.      Additional Information for this encounter:  None

## 2016-03-08 NOTE — Discharge Summary (Signed)
Patient: Vincent Vega.  Admission Date: 03/04/2016   DOB: 1956/05/17  Discharge Date: 03/08/2016    MRN: [id number]  Discharge Attending: Stann Ore MD   Referring Physician: Christa See, MD  PCP: Christa See, MD       DISCHARGE SUMMARY     Discharge Information   Discharge Diagnoses:     MRSA Right lower extremity cellulitis with abscess status post I&D  Chronic back pain  BPH  Depression  Leukocytosis  Headache resolved         Discharge Medications:     Medication List      START taking these medications          mupirocin 2 % nasal ointment   Commonly known as:  BACTROBAN NASAL   Use one-half of tube in each nostril twice daily for five (5) days. After application, press sides of nose together and gently massage.       oxyCODONE-acetaminophen 5-325 MG per tablet   Commonly known as:  PERCOCET   Take 1 tablet by mouth every 6 (six) hours as needed.       sulfamethoxazole-trimethoprim 800-160 MG per tablet   Commonly known as:  BACTRIM DS,SEPTRA DS   Take 1 tablet by mouth 2 (two) times daily.         CONTINUE taking these medications          acetaminophen 325 MG tablet   Commonly known as:  TYLENOL       citalopram 20 MG tablet   Commonly known as:  CeleXA       finasteride 5 MG tablet   Commonly known as:  PROSCAR       gabapentin 600 MG tablet   Commonly known as:  NEURONTIN       LORazepam 2 MG tablet   Commonly known as:  ATIVAN       meloxicam 15 MG tablet   Commonly known as:  MOBIC       tamsulosin 0.4 MG Caps   Commonly known as:  FLOMAX       traMADol 50 MG tablet   Commonly known as:  ULTRAM            Where to Get Your Medications      You can get these medications from any pharmacy     Bring a paper prescription for each of these medications    - mupirocin 2 % nasal ointment  - sulfamethoxazole-trimethoprim 800-160 MG per tablet      Information about where to get these medications is not yet available     ! Ask  your nurse or doctor about these medications    - oxyCODONE-acetaminophen 5-325 MG per tablet              [facility] Course (4 Days)     Vicente Males Vincent Vega. is a 60 y.o. male who presented to the emergency department with fever and right lower extremity cellulitis patient initially started on Rocephin and vancomycin cultures from the wound positive for MRSA Rocephin was discontinued and vancomycin was continued. Patient was evaluated by general surgery underwent an I&D. Patient clinically improved he will be discharged home on by mouth antibiotics patient has a follow-up with PCP in 4 days to make sure there is improvement in his cellulitis.      Procedures/Imaging:   No orders to display       Treatment Team:   Attending Provider: Thedore Mins,  Kerrington Greenhalgh, MD  Consulting Physician: Leory Plowman, MD         Progress Note/Physical Exam at Discharge     Filed Vitals:    03/07/16 1610 03/07/16 1502 03/07/16 2058 03/08/16 0715   BP: 112/62 116/62 110/68 96/54   Pulse: 71 67 68 65   Temp: 98.5 F (36.9 C) 98.7 F (37.1 C) 98.1 F (36.7 C) 97.6 F (36.4 C)   TempSrc: Oral Oral Oral Oral   Resp: 20 20 20 18    Height:       Weight:       SpO2: 97% 99% 99% 98%       General: NAD, AAOx3   HEENT: perrla, eomi, sclera anicteric, OP: Clear, MMM  Neck: supple, FROM, no LAD  Cardiovascular: RRR, no m/r/g  Lungs: CTAB, no w/r/r  Abdomen: soft, +BS, NT/ND, no masses, no g/r  Extremities: Right calf erythema  Skin: no rashes or lesions noted       Diagnostics     Labs/Studies Pending at Discharge: No    Last Labs     Recent Labs  Lab 03/07/16  0536 03/05/16  0603 03/04/16  1940   WBC 6.3 8.7 12.0*   RBC 3.43* 3.46* 3.68*   HEMOGLOBIN 11.2* 11.3* 11.9*   HEMATOCRIT 33.9* 34.7* 36.2*   MCV 99 100 99   PLT CT 176 152 162         Recent Labs  Lab 03/07/16  0536 03/05/16  0603 03/04/16  1940   SODIUM 139 142 135*   POTASSIUM 4.0 3.7 3.6   CHLORIDE 105 109 101   CO2 27.9 27.6 26.8   BUN 11 16 20    CREATININE 0.73*  0.81 1.03   GLUCOSE 98 101* 102*   CALCIUM 8.5 8.6 8.6   MAGNESIUM  --  1.8  --         Patient Instructions   Discharge Diet: regular diet  Discharge Activity:  As tolerated     Follow Up Appointment:        Follow-up Information     Follow up with Laurence Aly, MD. Schedule an appointment as soon as possible for a visit on 03/11/2016.    Specialty:  Family Medicine    Why:  cellulitis  YOU WILL SEE BRIAN COLLINS THE PA. ON WEDNESDAY JUNE 7, AT 11:00 A.M.    Contact information:    [address]  [place] [address]  [phone]          Follow up with [facility] Access In 1 week.    Specialty:  Trauma Surgery    Contact information:    [address]  [address]  [place] [address]  [phone]    Additional information:    Located at [facility]        Follow up with Pcp, Noneorunknown, MD .           Time spent examining patient, discussing with patient/family regarding hospital course, chart review, reconciling medications and discharge planning: 38 minutes.      Stann Ore, MD  2:25 PM 03/08/2016     Cc: PCP and all consultants on listed above

## 2016-03-08 NOTE — Discharge Instructions (Signed)
Diet:  Resume previous home diet as tolerated.  No restrictions.    Activity:  Encourage ambulation as tolerated.  May use stairs if needed.    Wound Care:  Pack wound with saline-moistened gauze at least daily and recover with dry gauze and tape. Repeat for 3-4 days then dry dressing only.    Instructions:  Call/Return if temperature over 101.0, redness or purulent (pus) drainage or warmth to your incision(s), refractory nausea/vomiting, increased pain not relieved by medications, or new onset of chest pain or shortness or breath.  Call/Return if temperature over 101.0, refractory nausea/vomiting, increased pain not relieved by medications, or new onset of chest pain or shortness or breath.  You may add over-the-counter non-steroidal medications (NSAIDs) such as Ibuprofen or Naproxen as needed for pain control.    For additional questions or concerns after your discharge, you may contact the ACCESS office at [phone] between the hours of 8:00am - 4:30pm.  Any medication refill requests should be called to the office during daytime business hours.    ACCESS BOWEL MAINTENANCE GUIDELINES    The use of narcotic pain medications and decreased activity will lead to constipation.  In order to prevent this from happening to you, please follow these recommendations:  1. Drink adequate amounts of fluid throughout the day.    2. Take an over-the-counter stool softener, such as Colace, once or twice a day depending on the firmness of your stools.  3. You may take another over-the-counter stool softener, such as Senokot or generic Senna, two tablets at bedtime.  4. Add Metamucil or other similar fiber supplement to your diet twice a day.    If constipation persists:   1. Add Milk of Magnesia two (2) tablespoons (30mL) every 6    hours (up to 8 doses).  2. If you are experiencing rectal fullness or pressure, try a Dulcolax and/or   glycerin suppository every 12 hours.    . Avoid anti-diarrhea medications once your bowels  begin to move.  Loose         stools should resolve on their own after 2-3 days.  . Gas relief medications such as Gas-X or Gaviscon are acceptable for use.    . Probiotic food products, such as yogurt, are acceptable especially if you are or have been on antibiotics.    Additional Recipe for Success (Natural Stool Softener):    -1 cup of unsweetened applesauce,   - cup of unsweetened prune juice,   -1 cup of unprocessed bran (ex., [employer]'s brand - can be found at  [employer], [employer],        [employer], or other health food stores).    Mix together and store in refrigerator.    Take 2 tablespoons with a large glass of water daily and expect results 6-8 hours later.  You may add 1 tablespoon every 5-7 days if needed.   *can be used as long as desired, non-addictive, & gentle

## 2016-03-08 NOTE — Progress Notes (Signed)
Pharmacy Vancomycin Dosing Consult Note  Vincent Vega.    Assessment:   1. Day # 4 Vancomycin for RLE MRSA cellulitis/abscess associated with bug bite.  2. No new labs today but renal function stable, WBC WNL (leukocytosis resolved), afebrile.  Wound positive for MRSA. Blood NGTD.  3. Trough on 6/3 was subtherapeutic at 7.8 mcg/ml on 1250 mg Q12H. Prior to the 6th dose. Drawn 90 minutes early.      Plan:   1. Vancomycin increased to 1500 mg every 12 hours. One time dose of 500 mg given. Re-check trough on 6/5 @ 1900. Goal 10-15 mcg/ml.    2. Pharmacy will follow the patient's renal function, vancomycin levels, and dosing during the course of therapy. If you have any questions, please contact the pharmacist at [phone].     Indication: cellulitis    Age: 60 y.o.  Height: 1.88 m (6' 2.02")  Weight:  89.7 kg (197 lb 12 oz)  IBW: 82.2 kg  DW: N/A    CrCL: 128 ml/min    Other Nephrotoxic Drugs:     Drug Levels:   Trough level: 7.8 mcg/ml on 03/07/16 (1250 mg Q12H - drawn 90 min early, prior to the 6th dose)       Recent Labs  Lab 03/07/16  0536 03/05/16  0603 03/04/16  1940   CREATININE 0.73* 0.81 1.03   BUN 11 16 20        Recent Labs  Lab 03/07/16  0536 03/05/16  0603 03/04/16  1940   WBC 6.3 8.7 12.0*     Temp (24hrs), Avg:98.1 F (36.7 C), Min:97.6 F (36.4 C), Max:98.7 F (37.1 C)      Cultures:   Date Source Organism Sensitivities Resistance   5/31 Leg wound MRSA CD, LNZ, RIF, E. T/S, V OX   5/31 Blood- in progress NGTD

## 2016-03-08 NOTE — Op Note (Signed)
[facility] ACCESS     DATE OF OPERATION:   03/08/2016     ATTENDING SURGEON:   Terrill Mohr, MD.     PREOPERATIVE DIAGNOSIS:  Right calf abscess    POSTOPERATIVE DIAGNOSIS:  Right calf abscess    PROCEDURE:  Incision and drainage of right calf abscess      OPERATIVE FINDINGS:   Edema and cellulitis of right lower extremity noted.  Tenderness to palpation. Copious frank pus noted.  Culture obtained    ANESTHESIA:   12 ml 1% lidocaine    INTRAVENOUS FLUIDS:   N/A     ESTIMATED BLOOD LOSS:   Minimal     SPECIMENS:   Abscess culture     INDICATION FOR PROCEDURE:   Patient is a 60 year old man who presented on 03/04/16 with worsening cellulitis of right leg.  Diagnosed with MRSA.  Developed abscess on right leg as well.  Appropriate for surgical intervention.    OPERATIVE COURSE:   Obtained informed consent.  Prepped right calf in sterile fashion at the bedside.  Injected 12 ml 1% lidocaine for local anesthesia at site of lesion.  There was significant erythema and edema noted at the site, with a palpable area of fluctuance as well.  Using #10 blade to perform 2-3 cm incision.  Copious frank pus evacuated.  Obtained culture of bloody fluid.  Probed cavity for loculations and extensions.  None noted.  Washed out wound with 50 ml of normal saline.  Packed wound with saline soaked gauze dressing.  Covered with sterile gauze.  Patient tolerated well.  No complications.

## 2016-03-08 NOTE — Plan of Care (Signed)
Patient lying in bed watching TV skin w/d resp even and unlabored mucus membranes pink and moist continue to have some reddness to left leg with dressing to back of leg no c/o pain or discomfort at this time bed lowered and locked call light in reach

## 2016-03-09 ENCOUNTER — Other Ambulatory Visit

## 2016-03-09 ENCOUNTER — Telehealth: Payer: Self-pay | Admitting: Internal Medicine

## 2016-03-09 LAB — RICKETTSIA ANTIBODY PANEL WITH RFLX TITERS
Rickettsia typhi, IgG: NOT DETECTED
Rickettsia typhi, IgM: NOT DETECTED
Rocky Mountain spotted fever (RMSF), IgG: NOT DETECTED
Rocky Mountain spotted fever (RMSF), IgM: NOT DETECTED

## 2016-03-09 LAB — EHRLICHIA CHAFFEENSIS ANTIBODIES (IGG,IGM)
Ehrlichia chaffeensis IgM: <1:20 {titer}
Ehrlichia chaffeensis, IgG: <1:64 {titer}

## 2016-03-09 NOTE — Telephone Encounter (Signed)
Pt is scheduled. Thank you.

## 2016-03-09 NOTE — Telephone Encounter (Signed)
FYI, Pt was discharged on 05/31. Dx was MRSA. This was in [place] pt had a bug bite which turned into Celulitis. I and D done being packed for four to five days. No appt avail to sch. Pt had to been seen by Friday. Pt will be in town on Wednesday 6/7. Let me know where to sch appt?   Call pt @ [phone]. Thank you!

## 2016-03-09 NOTE — Telephone Encounter (Signed)
OK to schedule at 1130 on Thur/Fri this week?

## 2016-03-09 NOTE — Telephone Encounter (Signed)
Thursday 11:30

## 2016-03-12 ENCOUNTER — Encounter: Payer: Self-pay | Admitting: Internal Medicine

## 2016-03-12 ENCOUNTER — Other Ambulatory Visit (HOSPITAL_COMMUNITY): Payer: Self-pay | Admitting: Physician Assistant

## 2016-03-12 ENCOUNTER — Other Ambulatory Visit: Payer: Self-pay | Admitting: Internal Medicine

## 2016-03-12 ENCOUNTER — Ambulatory Visit (INDEPENDENT_AMBULATORY_CARE_PROVIDER_SITE_OTHER): Admitting: Internal Medicine

## 2016-03-12 ENCOUNTER — Ambulatory Visit
Admission: RE | Admit: 2016-03-12 | Discharge: 2016-03-12 | Disposition: A | Discharge status: Home / Self Care | Source: Ambulatory Visit | Attending: Internal Medicine | Admitting: Internal Medicine

## 2016-03-12 VITALS — BP 106/70 | HR 71 | Temp 98.2°F | Resp 12 | Ht 74.0 in | Wt 200.2 lb

## 2016-03-12 DIAGNOSIS — L02415 Cutaneous abscess of right lower limb: Secondary | ICD-10-CM | POA: Insufficient documentation

## 2016-03-12 DIAGNOSIS — L03119 Cellulitis of unspecified part of limb: Principal | ICD-10-CM

## 2016-03-12 DIAGNOSIS — L03115 Cellulitis of right lower limb: Secondary | ICD-10-CM | POA: Diagnosis not present

## 2016-03-12 DIAGNOSIS — L02419 Cutaneous abscess of limb, unspecified: Secondary | ICD-10-CM | POA: Diagnosis present

## 2016-03-12 MED ORDER — OXYCODONE-ACETAMINOPHEN 5-325 MG PO TABS: 1.0000 | ORAL_TABLET | Freq: Four times a day (QID) | ORAL | Status: DC | PRN

## 2016-03-12 MED ORDER — IOPAMIDOL (ISOVUE-300) INJECTION 61%
100.0000 mL | Freq: Once | INTRAVENOUS | Status: AC | PRN
  Administered 2016-03-12: 100 mL via INTRAVENOUS

## 2016-03-12 NOTE — Assessment & Plan Note (Addendum)
Right thigh is still indurated and tense to despite  I & D that was done on day of dsicharge from [place] [facility] June 4th.  I suspect he has loculated pockets of pus  Were not drained .  Ordering CT leg to evaluate tissues folllowed by local evaluation by General Surgery .   Continue daily packing and Setpra Ds.  Refilling oxycodone   Addendum: CT scan of knee was negative for gas ,osteomyelitis and septic appearing joint but did suggest a fluid collection along the lateral side of the biceps femoris. Patient has been afebrile and his pain has not been severe . Nevertheless, His appointment with Dr Jamal Collin has been moved up to 11:15 am on Friday . (discussed doc to doc this afternoon).

## 2016-03-12 NOTE — Progress Notes (Addendum)
Subjective:  Patient ID: Jonathon Hill., male    DOB: 1956/01/27  Age: 60 y.o. MRN: [id number]  CC: The encounter diagnosis was Abscess of right lower extremity excluding foot.  HPI Jonathon Hill. presents for hospital follow up  Patient was admitted to [facility]  in [place] on May 31 with cellulitis and abscess of his  RLE.   History;  Developed redness and progressive swelling of a presumed bug or tick bite on Sunday May 28 while hiking.    Started taking doxycycline on Tuesday when leg became erythematous and he spiked a fever to 102.  Spontaneous drainage began on Tuesday. However by  Wednesday afternoon her developed redness that extended from proximal thigh to ankle.and leg was very tight/swollen so his wife took him to local ER.  Was admitted,  Started on IV , Vanc and Rocephin while cultures of spontaneous drainage.  Abscess was not drained until day of discharge , June 4 . Wound was NOT PACKED with iodoform gauze, just sterile gauze.   He was discharged on June 4 with oxycodone, Septra DS and mupirocin and follow up trauma surgeon on June 7 but they were unable to see him so returned to PCP for evaluation .  Wife who has EMT training has been packing it daily with iodoform gauze. She has noted a mild to moderate amoutn of drainage on the dressing.  No fevers since discharge., but leg is still painful, Erythem is receding but entire thigh is indurated and tense.   Outpatient Prescriptions Prior to Visit  Medication Sig Dispense Refill  . acetaminophen (TYLENOL) 325 MG tablet Take 650 mg by mouth as needed.      . citalopram (CELEXA) 20 MG tablet TAKE 1 TABLET DAILY 90 tablet 1  . finasteride (PROSCAR) 5 MG tablet TAKE 1 TABLET DAILY 90 tablet 1  . gabapentin (NEURONTIN) 600 MG tablet TAKE 2 TABLETS THREE TIMES A DAY 540 tablet 3  . LORazepam (ATIVAN) 2 MG tablet 2mg  as needed at bedtime for insomnia, and 4mg  2 hours prior to procedures. 10 tablet 0  . meloxicam  (MOBIC) 15 MG tablet TAKE 1 TABLET DAILY 90 tablet 1  . tamsulosin (FLOMAX) 0.4 MG CAPS capsule Take 1 capsule (0.4 mg total) by mouth daily. 90 capsule 3  . traMADol (ULTRAM) 50 MG tablet Take 1 tablet (50 mg total) by mouth every 8 (eight) hours as needed. 90 tablet 2   No facility-administered medications prior to visit.    Review of Systems;  Patient denies headache, fevers, malaise, unintentional weight loss, skin rash, eye pain, sinus congestion and sinus pain, sore throat, dysphagia,  hemoptysis , cough, dyspnea, wheezing, chest pain, palpitations, orthopnea, edema, abdominal pain, nausea, melena, diarrhea, constipation, flank pain, dysuria, hematuria, urinary  Frequency, nocturia, numbness, tingling, seizures,  Focal weakness, Loss of consciousness,  Tremor, insomnia, depression, anxiety, and suicidal ideation.      Objective:  BP 106/70 mmHg  Pulse 71  Temp(Src) 98.2 F (36.8 C) (Oral)  Resp 12  Ht 6\' 2"  (1.88 m)  Wt 200 lb 4 oz (90.833 kg)  BMI 25.70 kg/m2  SpO2 95%  BP Readings from Last 3 Encounters:  03/12/16 106/70  01/02/16 108/67  10/04/15 117/78    Wt Readings from Last 3 Encounters:  03/12/16 200 lb 4 oz (90.833 kg)  01/02/16 198 lb (89.812 kg)  10/04/15 193 lb 1.6 oz (87.59 kg)    General appearance: alert, cooperative and appears stated age  Neck: no adenopathy, no carotid bruit, supple, symmetrical, trachea midline and thyroid not enlarged, symmetric, no tenderness/mass/nodules Lungs: clear to auscultation bilaterally Heart: regular rate and rhythm, S1, S2 normal, no murmur, click, rub or gallop Pulses: 2+ and symmetric Skin/EXT: right posterior thigh indurated and tense,  Lines of demarcation show recession of erythema  Lymph nodes: Cervical, supraclavicular, and axillary nodes normal.  Lab Results  Component Value Date   HGBA1C 5.5 08/08/2015   HGBA1C 5.7 07/27/2014    Lab Results  Component Value Date   CREATININE 0.72 08/08/2015    CREATININE 0.84 01/24/2015   CREATININE 0.9 07/27/2014    Lab Results  Component Value Date   WBC 7.1 08/08/2015   HGB 12.9* 08/08/2015   HCT 39.0 08/08/2015   PLT 183.0 08/08/2015   GLUCOSE 94 08/08/2015   CHOL 165 08/08/2015   TRIG 78.0 08/08/2015   HDL 56.50 08/08/2015   LDLDIRECT 169.2 07/31/2013   LDLCALC 93 08/08/2015   ALT 18 08/08/2015   AST 14 08/08/2015   NA 142 08/08/2015   K 3.9 08/08/2015   CL 102 08/08/2015   CREATININE 0.72 08/08/2015   BUN 16 08/08/2015   CO2 33* 08/08/2015   TSH 2.760 08/08/2015   PSA 0.12 01/24/2015   HGBA1C 5.5 08/08/2015    Assessment & Plan:   Problem List Items Addressed This Visit    Abscess of right lower extremity excluding foot - Primary    Right thigh is still indurated and tense to despite  I & D that was done on day of [place] from [place] [facility] June 4th.  I suspect he has loculated pockets of pus  Were not drained .  Ordering CT leg to evaluate tissues folllowed by local evaluation by General Surgery .   Continue daily packing and Setpra Ds.  Refilling oxycodone   Addendum: CT scan of knee was negative for gas ,osteomyelitis and septic appearing joint but did suggest a fluid collection along the lateral side of the biceps femoris. Patient has been afebrile and his pain has not been severe . Nevertheless, His appointment with Dr Jamal Collin has been moved up to 11:15 am on Friday . (discussed doc to doc this afternoon).         Relevant Orders   CT Angio Low Extrem Right W/Cm &/Or Wo/Cm   Ambulatory referral to General Surgery      I am having [name] maintain his acetaminophen, tamsulosin, meloxicam, gabapentin, citalopram, finasteride, traMADol, LORazepam, sulfamethoxazole-trimethoprim, mupirocin nasal ointment, and oxyCODONE-acetaminophen.  Meds ordered this encounter  Medications  . sulfamethoxazole-trimethoprim (BACTRIM DS,SEPTRA DS) 800-160 MG tablet    Sig: Take 1 tablet by mouth 2 (two) times daily.  .  mupirocin nasal ointment (BACTROBAN NASAL) 2 %    Sig: Place 1 application into the nose 2 (two) times daily.   Marland Kitchen DISCONTD: oxyCODONE-acetaminophen (PERCOCET/ROXICET) 5-325 MG tablet    Sig: Take 1 tablet by mouth 4 (four) times daily as needed.  Marland Kitchen oxyCODONE-acetaminophen (PERCOCET/ROXICET) 5-325 MG tablet    Sig: Take 1 tablet by mouth 4 (four) times daily as needed.    Dispense:  60 tablet    Refill:  0    Medications Discontinued During This Encounter  Medication Reason  . oxyCODONE-acetaminophen (PERCOCET/ROXICET) 5-325 MG tablet Reorder    Follow-up: No Follow-up on file.   Crecencio Mc, MD

## 2016-03-12 NOTE — Progress Notes (Signed)
Pre-visit discussion using our clinic review tool. No additional management support is needed unless otherwise documented below in the visit note.  

## 2016-03-12 NOTE — Patient Instructions (Addendum)
Referral to Dr Shirlyn Goltz and CT of leg are in process.   Stay in [place] until you hear from the office

## 2016-03-12 NOTE — Assessment & Plan Note (Signed)
Addendum: CT scan of knee was negative for gas ,osteomyelitis and septic appearing joint but did suggest a fluid collection along the lateral side of the biceps femoris.  Patient has been afebrile and his pain has not been severe . Nevertheless,  His appointment with Dr Jamal Collin has been moved up to 11:15 am on Friday .  (discussed doc to doc this afternoon).

## 2016-03-13 ENCOUNTER — Encounter: Payer: Self-pay | Admitting: General Surgery

## 2016-03-13 ENCOUNTER — Ambulatory Visit (INDEPENDENT_AMBULATORY_CARE_PROVIDER_SITE_OTHER): Admitting: General Surgery

## 2016-03-13 VITALS — BP 130/66 | HR 82 | Resp 12 | Ht 72.0 in | Wt 189.0 lb

## 2016-03-13 DIAGNOSIS — L02415 Cutaneous abscess of right lower limb: Secondary | ICD-10-CM

## 2016-03-13 NOTE — Patient Instructions (Signed)
Patient is to monitor symptoms of increased swelling, fevers, or chills and call the office if he develops any of these. Patient is able to continue activities as able. Patient to follow-up on Monday for re-evaluation of wound.  Patient is able to keep wound covered with gauze but may discontinue packing the wound.

## 2016-03-13 NOTE — Progress Notes (Signed)
Patient ID: Jonathon Hill., male   DOB: 1955-12-13, 59 y.o.   MRN: [id number]  Chief Complaint  Patient presents with  . Other    Right leg cellulitis    HPI Jonathon Hill. is a 59 y.o. male here for evaluation of cellulitis and MRSA infection of his right leg. He is currently on antibiotic therapy. He states he noticed a bug bit on his right leg three weeks ago. He was seen in the ER (in [place])on the 03/04/16 ang discharged him on March 08, 2016 after the I&D was done. Culture-MRSA. Patient states his leg feels tight and endorses pain with movement.  Denies fevers or chills. Patient is currently taking Septra antibiotic and has a Mupirocin cream.  He does state the discomfort and knee bend have improved.  HPI  Past Medical History  Diagnosis Date  . Hyperlipidemia   . Anxiety   . Cataract   . History of MRSA infection 01/03/16  . History of cellulitis 1998    right knee  . Arthritis   . Enlarged prostate     Past Surgical History  Procedure Laterality Date  . Spine surgery  1994, 2004    2 prior lumbar surgeries ([facility])  . Eye surgery  2001    LT, cataract, detached retina 2001,  buckle of sclera  [facility]  . Eye surgery  2004    RT, Cataract  . Eye surgery  2004     Lasik, LT    Family History  Problem Relation Age of Onset  . Cancer Mother   . Heart disease Father   . Depression Sister   . Cancer Sister   . Colon cancer Neg Hx   . Esophageal cancer Neg Hx   . Rectal cancer Neg Hx   . Stomach cancer Neg Hx     Social History Social History  Substance Use Topics  . Smoking status: Never Smoker   . Smokeless tobacco: Never Used  . Alcohol Use: Yes     Comment: rare    No Known Allergies  Current Outpatient Prescriptions  Medication Sig Dispense Refill  . acetaminophen (TYLENOL) 325 MG tablet Take 650 mg by mouth as needed.      . citalopram (CELEXA) 20 MG tablet TAKE 1 TABLET DAILY 90 tablet 1  . finasteride (PROSCAR) 5 MG tablet TAKE 1  TABLET DAILY 90 tablet 1  . gabapentin (NEURONTIN) 600 MG tablet TAKE 2 TABLETS THREE TIMES A DAY 540 tablet 3  . LORazepam (ATIVAN) 2 MG tablet 2mg  as needed at bedtime for insomnia, and 4mg  2 hours prior to procedures. 10 tablet 0  . meloxicam (MOBIC) 15 MG tablet TAKE 1 TABLET DAILY 90 tablet 1  . tamsulosin (FLOMAX) 0.4 MG CAPS capsule Take 1 capsule (0.4 mg total) by mouth daily. 90 capsule 3  . traMADol (ULTRAM) 50 MG tablet Take 1 tablet (50 mg total) by mouth every 8 (eight) hours as needed. 90 tablet 2  . mupirocin nasal ointment (BACTROBAN NASAL) 2 % Place 1 application into the nose 2 (two) times daily.     Marland Kitchen oxyCODONE-acetaminophen (PERCOCET/ROXICET) 5-325 MG tablet Take 1 tablet by mouth 4 (four) times daily as needed. 60 tablet 0  . sulfamethoxazole-trimethoprim (BACTRIM DS,SEPTRA DS) 800-160 MG tablet Take 1 tablet by mouth 2 (two) times daily.     No current facility-administered medications for this visit.    Review of Systems Review of Systems  Constitutional: Negative.   Respiratory: Negative.  Cardiovascular: Negative.     Blood pressure 130/66, pulse 82, resp. rate 12, height 6' (1.829 m), weight 189 lb (85.73 kg).  Physical Exam Physical Exam  Constitutional: He is oriented to person, place, and time. He appears well-developed and well-nourished.  Eyes: Conjunctivae are normal. No scleral icterus.  Neck: Neck supple.  Cardiovascular: Normal rate, regular rhythm and normal heart sounds.   Pulmonary/Chest: Effort normal and breath sounds normal.  Abdominal: Soft. Bowel sounds are normal. There is no tenderness.  Musculoskeletal:       Right knee: He exhibits decreased range of motion.  Neurological: He is alert and oriented to person, place, and time.  Skin: Skin is warm and dry. Lesion noted. There is erythema.       Data Reviewed CT scan reviewed. There appears to be a very narrow band of fluid 6cm long going up from open wound. This appears deep  subcutaneous or subfascial.  Assessment    Right leg abscess and cellulitis. By history this has been improving, albeit slowly. Will reevaluate in 3 days    Plan    Patient is to monitor symptoms of increased swelling, fevers, or chills and call the office if he develops any of these. Patient is able to continue activities as able. Patient to follow-up on Monday for re-evaluation of wound.       This information has been scribed by Gaspar Cola CMA.  PCP: Geanie Logan 03/14/2016, 11:32 AM

## 2016-03-14 ENCOUNTER — Encounter: Payer: Self-pay | Admitting: General Surgery

## 2016-03-16 ENCOUNTER — Ambulatory Visit: Payer: Self-pay | Admitting: General Surgery

## 2016-03-16 ENCOUNTER — Ambulatory Visit (INDEPENDENT_AMBULATORY_CARE_PROVIDER_SITE_OTHER): Admitting: General Surgery

## 2016-03-16 VITALS — BP 116/70 | HR 76 | Resp 12 | Ht 72.0 in | Wt 189.0 lb

## 2016-03-16 DIAGNOSIS — L03115 Cellulitis of right lower limb: Secondary | ICD-10-CM

## 2016-03-16 DIAGNOSIS — W57XXXA Bitten or stung by nonvenomous insect and other nonvenomous arthropods, initial encounter: Secondary | ICD-10-CM

## 2016-03-16 NOTE — Patient Instructions (Signed)
Patient to finish his antibiotics. Use heat as needed.Return as needed.

## 2016-03-16 NOTE — Progress Notes (Signed)
Patient ID: Jonathon Hill., male   DOB: [birth year], 60 y.o.   MRN: [id number]  Chief Complaint  Patient presents with  . Follow-up    HPI Jonathon Hill. is a 60 y.o. male here today for his three day follow up right leg. Patient reports he is improving. Stiffness and pain is improved.   I have reviewed the history of present illness with the patient.    HPI  Past Medical History  Diagnosis Date  . Hyperlipidemia   . Anxiety   . Cataract   . History of MRSA infection 01/03/16  . History of cellulitis 1998    right knee  . Arthritis   . Enlarged prostate     Past Surgical History  Procedure Laterality Date  . Spine surgery  1994, 2004    2 prior lumbar surgeries ([facility])  . Eye surgery  2001    LT, cataract, detached retina 2001,  buckle of sclera  [facility]  . Eye surgery  2004    RT, Cataract  . Eye surgery  2004     Lasik, LT    Family History  Problem Relation Age of Onset  . Cancer Mother   . Heart disease Father   . Depression Sister   . Cancer Sister   . Colon cancer Neg Hx   . Esophageal cancer Neg Hx   . Rectal cancer Neg Hx   . Stomach cancer Neg Hx     Social History Social History  Substance Use Topics  . Smoking status: Never Smoker   . Smokeless tobacco: Never Used  . Alcohol Use: Yes     Comment: rare    No Known Allergies  Current Outpatient Prescriptions  Medication Sig Dispense Refill  . acetaminophen (TYLENOL) 325 MG tablet Take 650 mg by mouth as needed.      . citalopram (CELEXA) 20 MG tablet TAKE 1 TABLET DAILY 90 tablet 1  . finasteride (PROSCAR) 5 MG tablet TAKE 1 TABLET DAILY 90 tablet 1  . gabapentin (NEURONTIN) 600 MG tablet TAKE 2 TABLETS THREE TIMES A DAY 540 tablet 3  . LORazepam (ATIVAN) 2 MG tablet 2mg  as needed at bedtime for insomnia, and 4mg  2 hours prior to procedures. 10 tablet 0  . meloxicam (MOBIC) 15 MG tablet TAKE 1 TABLET DAILY 90 tablet 1  . mupirocin nasal ointment (BACTROBAN NASAL) 2 % Place 1  application into the nose 2 (two) times daily.     Marland Kitchen oxyCODONE-acetaminophen (PERCOCET/ROXICET) 5-325 MG tablet Take 1 tablet by mouth 4 (four) times daily as needed. 60 tablet 0  . sulfamethoxazole-trimethoprim (BACTRIM DS,SEPTRA DS) 800-160 MG tablet Take 1 tablet by mouth 2 (two) times daily.    . tamsulosin (FLOMAX) 0.4 MG CAPS capsule Take 1 capsule (0.4 mg total) by mouth daily. 90 capsule 3  . traMADol (ULTRAM) 50 MG tablet Take 1 tablet (50 mg total) by mouth every 8 (eight) hours as needed. 90 tablet 2   No current facility-administered medications for this visit.    Review of Systems Review of Systems  Constitutional: Negative.   Respiratory: Negative.   Cardiovascular: Negative.     Blood pressure 116/70, pulse 76, resp. rate 12, height 6' (1.829 m), weight 189 lb (85.73 kg).  Physical Exam Physical Exam  Constitutional: He appears well-developed and well-nourished.  Neurological: He is alert. He has normal reflexes.  Skin: Skin is warm and dry.     Induration appears improved and down to 3  cm. No active drainage. Redness resolved.   Data Reviewed Prior note  Assessment    Cellulitis/abscess secondary to insect bite appears to be resolving satisfactorily     Plan      Patient to finish his antibiotics. Use heat as needed. Return as needed.  Patient to call office if developing fever, spreading redness or other concerns.   PCP:  Deborra Medina  This information has been scribed by Gaspar Cola CMA.   Jonathon Hill 03/17/2016, 1:15 PM

## 2016-03-17 ENCOUNTER — Encounter: Payer: Self-pay | Admitting: General Surgery

## 2016-05-18 ENCOUNTER — Ambulatory Visit
Admission: RE | Admit: 2016-05-18 | Discharge: 2016-05-18 | Disposition: A | Discharge status: Home / Self Care | Source: Ambulatory Visit | Attending: Sports Medicine | Admitting: Sports Medicine

## 2016-05-18 VITALS — BP 110/71 | HR 66

## 2016-05-18 DIAGNOSIS — M5030 Other cervical disc degeneration, unspecified cervical region: Secondary | ICD-10-CM

## 2016-05-18 MED ORDER — IOPAMIDOL (ISOVUE-M 300) INJECTION 61%
1.0000 mL | Freq: Once | INTRAMUSCULAR | Status: AC | PRN
  Administered 2016-05-18: 1 mL via EPIDURAL

## 2016-05-18 MED ORDER — TRIAMCINOLONE ACETONIDE 40 MG/ML IJ SUSP (RADIOLOGY)
60.0000 mg | Freq: Once | INTRAMUSCULAR | Status: AC
  Administered 2016-05-18: 60 mg via EPIDURAL

## 2016-05-18 NOTE — Discharge Instructions (Signed)

## 2016-05-25 ENCOUNTER — Ambulatory Visit: Admitting: Internal Medicine

## 2016-05-26 ENCOUNTER — Other Ambulatory Visit (INDEPENDENT_AMBULATORY_CARE_PROVIDER_SITE_OTHER)

## 2016-05-26 DIAGNOSIS — R5383 Other fatigue: Secondary | ICD-10-CM

## 2016-05-26 DIAGNOSIS — R7301 Impaired fasting glucose: Secondary | ICD-10-CM

## 2016-05-26 DIAGNOSIS — N401 Enlarged prostate with lower urinary tract symptoms: Secondary | ICD-10-CM

## 2016-05-26 DIAGNOSIS — R748 Abnormal levels of other serum enzymes: Secondary | ICD-10-CM | POA: Diagnosis not present

## 2016-05-26 DIAGNOSIS — N400 Enlarged prostate without lower urinary tract symptoms: Secondary | ICD-10-CM

## 2016-05-26 DIAGNOSIS — E785 Hyperlipidemia, unspecified: Secondary | ICD-10-CM

## 2016-05-26 DIAGNOSIS — R338 Other retention of urine: Principal | ICD-10-CM

## 2016-05-26 LAB — COMPREHENSIVE METABOLIC PANEL
ALBUMIN: 4.1 g/dL (ref 3.5–5.2)
ALK PHOS: 55 U/L (ref 39–117)
ALT: 14 U/L (ref 0–53)
AST: 15 U/L (ref 0–37)
BILIRUBIN TOTAL: 0.4 mg/dL (ref 0.2–1.2)
BUN: 18 mg/dL (ref 6–23)
CALCIUM: 8.7 mg/dL (ref 8.4–10.5)
CO2: 29 mEq/L (ref 19–32)
CREATININE: 0.86 mg/dL (ref 0.40–1.50)
Chloride: 106 mEq/L (ref 96–112)
GFR: 96.81 mL/min (ref >60.00)
Glucose, Bld: 102 mg/dL — ABNORMAL HIGH (ref 70–99)
Potassium: 4.6 mEq/L (ref 3.5–5.1)
Sodium: 140 mEq/L (ref 135–145)
TOTAL PROTEIN: 6.2 g/dL (ref 6.0–8.3)

## 2016-05-26 LAB — MICROALBUMIN / CREATININE URINE RATIO
CREATININE, U: 258.6 mg/dL
MICROALB UR: 1.3 mg/dL (ref 0.0–1.9)
MICROALB/CREAT RATIO: 0.5 mg/g (ref 0.0–30.0)

## 2016-05-26 LAB — CBC WITH DIFFERENTIAL/PLATELET
BASOS ABS: 0 10*3/uL (ref 0.0–0.1)
Basophils Relative: 0.2 % (ref 0.0–3.0)
EOS ABS: 0.1 10*3/uL (ref 0.0–0.7)
Eosinophils Relative: 2.1 % (ref 0.0–5.0)
HCT: 40 % (ref 39.0–52.0)
Hemoglobin: 13.1 g/dL (ref 13.0–17.0)
LYMPHS ABS: 1.8 10*3/uL (ref 0.7–4.0)
Lymphocytes Relative: 27.6 % (ref 12.0–46.0)
MCHC: 32.8 g/dL (ref 30.0–36.0)
MCV: 93.2 fl (ref 78.0–100.0)
MONO ABS: 0.4 10*3/uL (ref 0.1–1.0)
MONOS PCT: 6.8 % (ref 3.0–12.0)
NEUTROS PCT: 63.3 % (ref 43.0–77.0)
Neutro Abs: 4.2 10*3/uL (ref 1.4–7.7)
Platelets: 192 10*3/uL (ref 150.0–400.0)
RBC: 4.29 Mil/uL (ref 4.22–5.81)
RDW: 13.7 % (ref 11.5–15.5)
WBC: 6.6 10*3/uL (ref 4.0–10.5)

## 2016-05-26 LAB — TSH: TSH: 3.02 u[IU]/mL (ref 0.35–4.50)

## 2016-05-26 LAB — LDL CHOLESTEROL, DIRECT: LDL DIRECT: 111 mg/dL

## 2016-05-26 LAB — HEMOGLOBIN A1C: Hgb A1c MFr Bld: 5.6 % (ref 4.6–6.5)

## 2016-05-26 LAB — PSA: PSA: 0.14 ng/mL (ref 0.10–4.00)

## 2016-05-26 LAB — LIPID PANEL
CHOLESTEROL: 175 mg/dL (ref 0–200)
HDL: 52.6 mg/dL (ref >39.00)
LDL Cholesterol: 104 mg/dL — ABNORMAL HIGH (ref 0–99)
NONHDL: 121.93
Total CHOL/HDL Ratio: 3
Triglycerides: 89 mg/dL (ref 0.0–149.0)
VLDL: 17.8 mg/dL (ref 0.0–40.0)

## 2016-05-27 ENCOUNTER — Ambulatory Visit (INDEPENDENT_AMBULATORY_CARE_PROVIDER_SITE_OTHER): Admitting: Internal Medicine

## 2016-05-27 ENCOUNTER — Other Ambulatory Visit: Payer: Self-pay | Admitting: Internal Medicine

## 2016-05-27 ENCOUNTER — Encounter: Payer: Self-pay | Admitting: Internal Medicine

## 2016-05-27 DIAGNOSIS — N400 Enlarged prostate without lower urinary tract symptoms: Secondary | ICD-10-CM | POA: Diagnosis not present

## 2016-05-27 DIAGNOSIS — Z23 Encounter for immunization: Secondary | ICD-10-CM | POA: Diagnosis not present

## 2016-05-27 DIAGNOSIS — F411 Generalized anxiety disorder: Secondary | ICD-10-CM

## 2016-05-27 DIAGNOSIS — R7301 Impaired fasting glucose: Secondary | ICD-10-CM | POA: Diagnosis not present

## 2016-05-27 DIAGNOSIS — M5416 Radiculopathy, lumbar region: Secondary | ICD-10-CM

## 2016-05-27 DIAGNOSIS — N401 Enlarged prostate with lower urinary tract symptoms: Secondary | ICD-10-CM

## 2016-05-27 DIAGNOSIS — R748 Abnormal levels of other serum enzymes: Secondary | ICD-10-CM

## 2016-05-27 DIAGNOSIS — M129 Arthropathy, unspecified: Secondary | ICD-10-CM

## 2016-05-27 DIAGNOSIS — M19049 Primary osteoarthritis, unspecified hand: Secondary | ICD-10-CM

## 2016-05-27 DIAGNOSIS — R338 Other retention of urine: Secondary | ICD-10-CM

## 2016-05-27 MED ORDER — DIAZEPAM 5 MG PO TABS: 5.0000 mg | ORAL_TABLET | Freq: Two times a day (BID) | ORAL | 4 refills | Status: DC | PRN

## 2016-05-27 NOTE — Progress Notes (Signed)
Pre-visit discussion using our clinic review tool. No additional management support is needed unless otherwise documented below in the visit note.  

## 2016-05-27 NOTE — Patient Instructions (Addendum)
You can add 2000 mg tylenol daily to enhance tramadol for pain relief

## 2016-05-27 NOTE — Progress Notes (Signed)
Subjective:  Patient ID: Jonathon Hill., male    DOB: 1955-11-03  Age: 60 y.o. MRN: [id number]  CC: Diagnoses of Encounter for immunization, Generalized anxiety disorder, Benign prostatic hypertrophy with urinary retention, Impaired fasting glucose, Hand arthritis, Elevated liver enzymes, and Left lumbar radiculitis were pertinent to this visit.  HPI Jonathon Hill. presents for follow up on chronic pain, GAD/ depression and recent weight loss of uncertain etiology.   Cc: disrupted sleep:  Not sleeping due to pain in hands/ feet and muscle spasms.  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  Discussed using valium for muscle spasm  BPH on 2 meds.  Not woken by urge to urinate more than once , but voids 2-3 times per night . Deferring prostate reduction surgery  Despite recommendations from Urology ,has not had an episode of acute urinary retention yet.   Persistent right hand pain due to OA   Weight loss: resolved. Has gained 10 lbs since last visit.    Outpatient Medications Prior to Visit  Medication Sig Dispense Refill  . acetaminophen (TYLENOL) 325 MG tablet Take 650 mg by mouth as needed.      . gabapentin (NEURONTIN) 600 MG tablet TAKE 2 TABLETS THREE TIMES A DAY 540 tablet 3  . meloxicam (MOBIC) 15 MG tablet TAKE 1 TABLET DAILY 90 tablet 1  . traMADol (ULTRAM) 50 MG tablet Take 1 tablet (50 mg total) by mouth every 8 (eight) hours as needed. 90 tablet 2  . citalopram (CELEXA) 20 MG tablet TAKE 1 TABLET DAILY 90 tablet 1  . finasteride (PROSCAR) 5 MG tablet TAKE 1 TABLET DAILY 90 tablet 1  . LORazepam (ATIVAN) 2 MG tablet 2mg  as needed at bedtime for insomnia, and 4mg  2 hours prior to procedures. 10 tablet 0  . tamsulosin (FLOMAX) 0.4 MG CAPS capsule Take 1 capsule (0.4 mg total) by mouth daily. 90 capsule 3  . mupirocin nasal ointment (BACTROBAN NASAL) 2 % Place 1 application into the nose 2 (two) times daily.     Marland Kitchen oxyCODONE-acetaminophen  (PERCOCET/ROXICET) 5-325 MG tablet Take 1 tablet by mouth 4 (four) times daily as needed. (Patient not taking: Reported on 05/27/2016) 60 tablet 0   No facility-administered medications prior to visit.     Review of Systems;  Patient denies headache, fevers, malaise, unintentional weight loss, skin rash, eye pain, sinus congestion and sinus pain, sore throat, dysphagia,  hemoptysis , cough, dyspnea, wheezing, chest pain, palpitations, orthopnea, edema, abdominal pain, nausea, melena, diarrhea, constipation, flank pain, dysuria, hematuria, urinary  Frequency, nocturia, numbness, tingling, seizures,  Focal weakness, Loss of consciousness,  Tremor, insomnia, depression, anxiety, and suicidal ideation.      Objective:  BP 108/72   Pulse 66   Temp 98.4 F (36.9 C) (Oral)   Resp 12   Ht 6' (1.829 m)   Wt 199 lb (90.3 kg)   SpO2 97%   BMI 26.99 kg/m   BP Readings from Last 3 Encounters:  05/28/16 103/67  05/27/16 108/72  05/18/16 110/71    Wt Readings from Last 3 Encounters:  05/28/16 200 lb 6.4 oz (90.9 kg)  05/27/16 199 lb (90.3 kg)  03/16/16 189 lb (85.7 kg)    General appearance: alert, cooperative and appears stated age Ears: normal TM's and external ear canals both ears Throat: lips, mucosa, and tongue normal; teeth and gums normal Neck: no adenopathy, no carotid bruit, supple, symmetrical, trachea midline and thyroid not enlarged, symmetric, no  tenderness/mass/nodules Back: symmetric, no curvature. ROM normal. No CVA tenderness. Lungs: clear to auscultation bilaterally Heart: regular rate and rhythm, S1, S2 normal, no murmur, click, rub or gallop Abdomen: soft, non-tender; bowel sounds normal; no masses,  no organomegaly Pulses: 2+ and symmetric Skin: Skin color, texture, turgor normal. No rashes or lesions Lymph nodes: Cervical, supraclavicular, and axillary nodes normal.  Lab Results  Component Value Date   HGBA1C 5.6 05/26/2016   HGBA1C 5.5 08/08/2015   HGBA1C 5.7  07/27/2014    Lab Results  Component Value Date   CREATININE 0.86 05/26/2016   CREATININE 0.72 08/08/2015   CREATININE 0.84 01/24/2015    Lab Results  Component Value Date   WBC 6.6 05/26/2016   HGB 13.1 05/26/2016   HCT 40.0 05/26/2016   PLT 192.0 05/26/2016   GLUCOSE 102 (H) 05/26/2016   CHOL 175 05/26/2016   TRIG 89.0 05/26/2016   HDL 52.60 05/26/2016   LDLDIRECT 111.0 05/26/2016   LDLCALC 104 (H) 05/26/2016   ALT 14 05/26/2016   AST 15 05/26/2016   NA 140 05/26/2016   K 4.6 05/26/2016   CL 106 05/26/2016   CREATININE 0.86 05/26/2016   BUN 18 05/26/2016   CO2 29 05/26/2016   TSH 3.02 05/26/2016   PSA 0.14 05/26/2016   HGBA1C 5.6 05/26/2016   MICROALBUR 1.3 05/26/2016    Dg Inject Diag/thera/inc Needle/cath/plc Epi/cerv/thor W/img  Result Date: 05/18/2016 CLINICAL DATA:  Spondylosis without myelopathy. Left shoulder and arm weakness with occasional pain. No symptoms described on the right. FLUOROSCOPY TIME:  0 minutes 34 seconds. 12.23 micro gray meter squared PROCEDURE: CERVICAL EPIDURAL INJECTION An interlaminar approach was performed on the left at C7-T1. Clinical request was for C6-7 injection, but I did not feel there was sufficient epidural fat after reviewing the MRI. A 20 gauge epidural needle was advanced using loss-of-resistance technique. DIAGNOSTIC EPIDURAL INJECTION Injection of Isovue-M 300 shows a good epidural pattern with spread above and below the level of needle placement, primarily on the left. No vascular opacification is seen. Contrast easily spreads up to the C6-7 level and beyond. THERAPEUTIC EPIDURAL INJECTION 1.5 ml of Kenalog 40 mixed with 1 ml of 1% Lidocaine and 2 ml of normal saline were then instilled. The procedure was well-tolerated, and the patient was discharged thirty minutes following the injection in good condition. IMPRESSION: Technically successful initial epidural injection on the left at C7-T1. Electronically Signed   By: Nelson Chimes  M.D.   On: 05/18/2016 14:26    Assessment & Plan:   Problem List Items Addressed This Visit    Benign prostatic hypertrophy with urinary retention    He continues to defer definitive treatment.  Continue proscar and Flomax.       Anxiety disorder    Managed with celexa.  Use of lorazepam for insomnia changed today to valium for concurrent muscle spasms. The risks and benefits of benzodiazepine use were reviewed with patient today including excessive sedation leading to respiratory depression,  impaired thinking/driving, and addiction.  Patient was advised to avoid concurrent use with alcohol, to use medication only as needed and not to share with others  .       Left lumbar radiculitis    With loss of muscle mass noted on the left.. Prior decompressive surgeries at L4 and below.  Continue pain management,  Adding valium for muscle spasms at night       Relevant Medications   diazepam (VALIUM) 5 MG tablet   Hand arthritis  Continue tramadol and meloxicam  Add tylenol as needed       Impaired fasting glucose    Resolved with weight loss ,  He has No signs of DM by A1c.  Lab Results  Component Value Date   HGBA1C 5.6 05/26/2016         Elevated liver enzymes    Resolved,,  Lab Results  Component Value Date   ALT 14 05/26/2016   AST 15 05/26/2016   ALKPHOS 55 05/26/2016   BILITOT 0.4 05/26/2016          Other Visit Diagnoses    Encounter for immunization       Relevant Orders   Flu Vaccine QUAD 36+ mos IM (Completed)      I have discontinued Mr. Breining LORazepam, mupirocin nasal ointment, and oxyCODONE-acetaminophen. I am also having him start on diazepam. Additionally, I am having him maintain his acetaminophen, meloxicam, gabapentin, and traMADol.  Meds ordered this encounter  Medications  . diazepam (VALIUM) 5 MG tablet    Sig: Take 1 tablet (5 mg total) by mouth every 12 (twelve) hours as needed for anxiety.    Dispense:  60 tablet    Refill:  4     Medications Discontinued During This Encounter  Medication Reason  . mupirocin nasal ointment (BACTROBAN NASAL) 2 % Completed Course  . oxyCODONE-acetaminophen (PERCOCET/ROXICET) 5-325 MG tablet Completed Course  . LORazepam (ATIVAN) 2 MG tablet     Follow-up: No Follow-up on file.   Crecencio Mc, MD

## 2016-05-28 ENCOUNTER — Ambulatory Visit (INDEPENDENT_AMBULATORY_CARE_PROVIDER_SITE_OTHER): Admitting: Sports Medicine

## 2016-05-28 ENCOUNTER — Encounter: Payer: Self-pay | Admitting: Sports Medicine

## 2016-05-28 ENCOUNTER — Encounter: Payer: Self-pay | Admitting: Internal Medicine

## 2016-05-28 DIAGNOSIS — M129 Arthropathy, unspecified: Secondary | ICD-10-CM

## 2016-05-28 DIAGNOSIS — M25511 Pain in right shoulder: Secondary | ICD-10-CM | POA: Diagnosis not present

## 2016-05-28 DIAGNOSIS — M5030 Other cervical disc degeneration, unspecified cervical region: Secondary | ICD-10-CM

## 2016-05-28 DIAGNOSIS — M19049 Primary osteoarthritis, unspecified hand: Secondary | ICD-10-CM

## 2016-05-28 DIAGNOSIS — M25512 Pain in left shoulder: Secondary | ICD-10-CM | POA: Diagnosis not present

## 2016-05-28 NOTE — Progress Notes (Signed)
  Subjective:    CC: Shoulder and hand pain  HPI: Left shoulder pain: Did well for 4 months after subacromial injection, right shoulder is pain-free, requires repeat injection left shoulder.  Right hand osteoarthritis: His done well for 4 months after third and fourth MCP injections, desires repeat interventional treatment today.  Past medical history, Surgical history, Family history not pertinant except as noted below, Social history, Allergies, and medications have been entered into the medical record, reviewed, and no changes needed.   Review of Systems: No fevers, chills, night sweats, weight loss, chest pain, or shortness of breath.   Objective:    General: Well Developed, well nourished, and in no acute distress.  Neuro: Alert and oriented x3, extra-ocular muscles intact, sensation grossly intact.  HEENT: Normocephalic, atraumatic, pupils equal round reactive to light, neck supple, no masses, no lymphadenopathy, thyroid nonpalpable.  Skin: Warm and dry, no rashes. Cardiac: Regular rate and rhythm, no murmurs rubs or gallops, no lower extremity edema.  Respiratory: Clear to auscultation bilaterally. Not using accessory muscles, speaking in full sentences.  Procedure: Real-time Ultrasound Guided Injection of left subacromial bursa Device: GE Logiq E  Verbal informed consent obtained.  Time-out conducted.  Noted no overlying erythema, induration, or other signs of local infection.  Skin prepped in a sterile fashion.  Local anesthesia: Topical Ethyl chloride.  With sterile technique and under real time ultrasound guidance:  25-gauge needle advanced into the bursa and 1 mL Kenalog 40 with 3 mL lidocaine injected easily. Completed without difficulty  Pain immediately resolved suggesting accurate placement of the medication.  Advised to call if fevers/chills, erythema, induration, drainage, or persistent bleeding.  Images permanently stored and available for review in the ultrasound  unit.  Impression: Technically successful ultrasound guided injection.  Procedure: Real-time Ultrasound Guided Injection of right third MCP Device: GE Logiq E  Verbal informed consent obtained.  Time-out conducted.  Noted no overlying erythema, induration, or other signs of local infection.  Skin prepped in a sterile fashion.  Local anesthesia: Topical Ethyl chloride.  With sterile technique and under real time ultrasound guidance:  1/2 mL Kenalog 40 1/2 mL lidocaine injected easily. Completed without difficulty  Pain immediately resolved suggesting accurate placement of the medication.  Advised to call if fevers/chills, erythema, induration, drainage, or persistent bleeding.  Images permanently stored and available for review in the ultrasound unit.  Impression: Technically successful ultrasound guided injection.  .Procedure: Real-time Ultrasound Guided Injection of right fourth MCP Device: GE Logiq E  Verbal informed consent obtained.  Time-out conducted.  Noted no overlying erythema, induration, or other signs of local infection.  Skin prepped in a sterile fashion.  Local anesthesia: Topical Ethyl chloride.  With sterile technique and under real time ultrasound guidance:  1/2 mL Kenalog 40 1/2 mL lidocaine injected easily. Completed without difficulty  Pain immediately resolved suggesting accurate placement of the medication.  Advised to call if fevers/chills, erythema, induration, drainage, or persistent bleeding.  Images permanently stored and available for review in the ultrasound unit.  Impression: Technically successful ultrasound guided injection.  Impression and Recommendations:    Bilateral shoulder pain Left subacromial bursa injection as above  Degenerative cervical disc Pain-free after cervical epidural  Hand arthritis Repeat right third and fourth MCP injections on right side.

## 2016-05-28 NOTE — Assessment & Plan Note (Signed)
Repeat right third and fourth MCP injections on right side.

## 2016-05-28 NOTE — Assessment & Plan Note (Signed)
Pain-free after cervical epidural

## 2016-05-28 NOTE — Assessment & Plan Note (Signed)
Left subacromial bursa injection as above

## 2016-05-29 ENCOUNTER — Other Ambulatory Visit: Payer: Self-pay | Admitting: Internal Medicine

## 2016-05-29 ENCOUNTER — Ambulatory Visit: Admitting: Internal Medicine

## 2016-05-29 NOTE — Assessment & Plan Note (Signed)
Resolved,,  Lab Results  Component Value Date   ALT 14 05/26/2016   AST 15 05/26/2016   ALKPHOS 55 05/26/2016   BILITOT 0.4 05/26/2016

## 2016-05-29 NOTE — Assessment & Plan Note (Signed)
Managed with celexa.  Use of lorazepam for insomnia changed today to valium for concurrent muscle spasms. The risks and benefits of benzodiazepine use were reviewed with patient today including excessive sedation leading to respiratory depression,  impaired thinking/driving, and addiction.  Patient was advised to avoid concurrent use with alcohol, to use medication only as needed and not to share with others  .

## 2016-05-29 NOTE — Assessment & Plan Note (Signed)
He continues to defer definitive treatment.  Continue proscar and Flomax.

## 2016-05-29 NOTE — Assessment & Plan Note (Signed)
Continue tramadol and meloxicam  Add tylenol as needed

## 2016-05-29 NOTE — Assessment & Plan Note (Signed)
With loss of muscle mass noted on the left.. Prior decompressive surgeries at L4 and below.  Continue pain management,  Adding valium for muscle spasms at night

## 2016-05-29 NOTE — Assessment & Plan Note (Signed)
Resolved with weight loss ,  He has No signs of DM by A1c.  Lab Results  Component Value Date   HGBA1C 5.6 05/26/2016

## 2016-06-18 ENCOUNTER — Telehealth: Payer: Self-pay | Admitting: Internal Medicine

## 2016-06-18 NOTE — Telephone Encounter (Signed)
Please send me this message again so I will remember to work on it this weekend  There will be a charge.

## 2016-06-18 NOTE — Telephone Encounter (Signed)
This is the first I have heard of a letter,    waht exactly are they using the letter for?

## 2016-06-18 NOTE — Telephone Encounter (Signed)
This will take some time so it will not be ready before Monday

## 2016-06-18 NOTE — Telephone Encounter (Signed)
Please advise if you can complete this, thanks  Letter with issues related to Anxiety, depression and right hand. Thanks

## 2016-06-18 NOTE — Telephone Encounter (Signed)
Spoke with the patient's wife.  She really needs his Medical records and I guess Med records is giving her a crazy timeline to have them to her.  They are needing a statement that says he is being treated for the below items to put in a packet with all his prior records and all to the [place].  The letter needs to state that is is treated for anxiety, depression, his right hand, should and back currently.  With treatment options.  This will be a stand in for care with them until the actual records are received from our Med records.  She would like it sent via mychart as they are never in town to pick up.  Thanks

## 2016-06-18 NOTE — Telephone Encounter (Signed)
Pt is being seen by the [place].Marland Kitchen They are having to put a packet together they are not sure if the records will be available  to them by the appt .Marland Kitchen They would like you to send a letter to MyChart about his anxiety,depression  and right hand for the [place] .Marland Kitchen Please advise

## 2016-06-19 NOTE — Telephone Encounter (Signed)
Please advise,   Requested it to be resent by PCP. Thanks

## 2016-06-22 ENCOUNTER — Encounter: Payer: Self-pay | Admitting: Internal Medicine

## 2016-06-22 NOTE — Telephone Encounter (Signed)
Can you please place charge

## 2016-06-22 NOTE — Telephone Encounter (Signed)
Letter has ben sent via mychart ,  Please charge $50 as per usual for forms.

## 2016-09-21 ENCOUNTER — Other Ambulatory Visit: Payer: Self-pay | Admitting: Sports Medicine

## 2016-10-02 ENCOUNTER — Ambulatory Visit (INDEPENDENT_AMBULATORY_CARE_PROVIDER_SITE_OTHER): Admitting: Sports Medicine

## 2016-10-02 DIAGNOSIS — M25512 Pain in left shoulder: Secondary | ICD-10-CM | POA: Diagnosis not present

## 2016-10-02 DIAGNOSIS — M25511 Pain in right shoulder: Secondary | ICD-10-CM | POA: Diagnosis not present

## 2016-10-02 DIAGNOSIS — G8929 Other chronic pain: Secondary | ICD-10-CM

## 2016-10-02 DIAGNOSIS — M19049 Primary osteoarthritis, unspecified hand: Secondary | ICD-10-CM | POA: Diagnosis not present

## 2016-10-02 NOTE — Assessment & Plan Note (Signed)
Four-month response to previous injection, repeat left subacromial injection as above.

## 2016-10-02 NOTE — Progress Notes (Signed)
Subjective:    CC: Hand and shoulder pain  HPI: Subacromial bursitis: Does well with occasional subacromial injections, 2-3 times per year, previous injection was 4 months ago and he desires a repeat. Pain is moderate, persistent, localized over the deltoid, worse with overhead activities.  Right hand osteoarthritis: Worst at the third and fourth metacarpophalangeal joints, previous injection provided over 4 months of relief, desires repeat injections today.  Past medical history:  Negative.  See flowsheet/record as well for more information.  Surgical history: Negative.  See flowsheet/record as well for more information.  Family history: Negative.  See flowsheet/record as well for more information.  Social history: Negative.  See flowsheet/record as well for more information.  Allergies, and medications have been entered into the medical record, reviewed, and no changes needed.   Review of Systems: No fevers, chills, night sweats, weight loss, chest pain, or shortness of breath.   Objective:    General: Well Developed, well nourished, and in no acute distress.  Neuro: Alert and oriented x3, extra-ocular muscles intact, sensation grossly intact.  HEENT: Normocephalic, atraumatic, pupils equal round reactive to light, neck supple, no masses, no lymphadenopathy, thyroid nonpalpable.  Skin: Warm and dry, no rashes. Cardiac: Regular rate and rhythm, no murmurs rubs or gallops, no lower extremity edema.  Respiratory: Clear to auscultation bilaterally. Not using accessory muscles, speaking in full sentences.  Procedure: Real-time Ultrasound Guided Injection of left subacromial bursa Device: GE Logiq E  Verbal informed consent obtained.  Time-out conducted.  Noted no overlying erythema, induration, or other signs of local infection.  Skin prepped in a sterile fashion.  Local anesthesia: Topical Ethyl chloride.  With sterile technique and under real time ultrasound guidance:  25-gauge  needle advanced into the bursa and 1 mL Kenalog 40 with 3 mL lidocaine injected easily. Completed without difficulty  Pain immediately resolved suggesting accurate placement of the medication.  Advised to call if fevers/chills, erythema, induration, drainage, or persistent bleeding.  Images permanently stored and available for review in the ultrasound unit.  Impression: Technically successful ultrasound guided injection.  Procedure: Real-time Ultrasound Guided Injection of right third MCP Device: GE Logiq E  Verbal informed consent obtained.  Time-out conducted.  Noted no overlying erythema, induration, or other signs of local infection.  Skin prepped in a sterile fashion.  Local anesthesia: Topical Ethyl chloride.  With sterile technique and under real time ultrasound guidance:  1/2 mL Kenalog 40 1/2 mL lidocaine injected easily. Completed without difficulty  Pain immediately resolved suggesting accurate placement of the medication.  Advised to call if fevers/chills, erythema, induration, drainage, or persistent bleeding.  Images permanently stored and available for review in the ultrasound unit.  Impression: Technically successful ultrasound guided injection.  .Procedure: Real-time Ultrasound Guided Injection of right fourth MCP Device: GE Logiq E  Verbal informed consent obtained.  Time-out conducted.  Noted no overlying erythema, induration, or other signs of local infection.  Skin prepped in a sterile fashion.  Local anesthesia: Topical Ethyl chloride.  With sterile technique and under real time ultrasound guidance:  1/2 mL Kenalog 40 1/2 mL lidocaine injected easily. Completed without difficulty  Pain immediately resolved suggesting accurate placement of the medication.  Advised to call if fevers/chills, erythema, induration, drainage, or persistent bleeding.  Images permanently stored and available for review in the ultrasound unit.  Impression: Technically successful  ultrasound guided injection.  Impression and Recommendations:    Bilateral shoulder pain Four-month response to previous injection, repeat left subacromial injection as above.  Hand arthritis Right third and fourth metacarpophalangeal joint injections as above. Continue tramadol and meloxicam, may use Tylenol as needed.

## 2016-10-02 NOTE — Assessment & Plan Note (Signed)
Right third and fourth metacarpophalangeal joint injections as above. Continue tramadol and meloxicam, may use Tylenol as needed.

## 2016-10-22 ENCOUNTER — Other Ambulatory Visit: Payer: Self-pay | Admitting: Internal Medicine

## 2016-10-23 NOTE — Telephone Encounter (Signed)
Last OV 05/27/16 last filled 05/29/16 90 1rf

## 2016-11-02 ENCOUNTER — Telehealth: Payer: Self-pay | Admitting: Internal Medicine

## 2016-11-02 NOTE — Telephone Encounter (Signed)
New rx for diazepam (VALIUM) 5 MG tablet #60, take 1 tablet every 12 hours as needed for anxiety with 4 refills.

## 2016-11-03 MED ORDER — DIAZEPAM 5 MG PO TABS: 5.0000 mg | ORAL_TABLET | Freq: Two times a day (BID) | ORAL | 4 refills | Status: AC | PRN

## 2016-11-03 NOTE — Telephone Encounter (Signed)
Ok to Rf diazepam 5 mg?  Last OV with you was 05/27/16. Next OV is 02/11/17.  Last filled 05/27/16 # 60 with 4 Rf.

## 2016-11-03 NOTE — Telephone Encounter (Signed)
Refilled

## 2016-11-04 NOTE — Telephone Encounter (Signed)
Signed rx faxed to [facility].

## 2017-02-02 ENCOUNTER — Ambulatory Visit: Admitting: Sports Medicine

## 2017-02-03 ENCOUNTER — Encounter: Payer: Self-pay | Admitting: Sports Medicine

## 2017-02-03 ENCOUNTER — Ambulatory Visit (INDEPENDENT_AMBULATORY_CARE_PROVIDER_SITE_OTHER): Admitting: Sports Medicine

## 2017-02-03 DIAGNOSIS — M19049 Primary osteoarthritis, unspecified hand: Secondary | ICD-10-CM

## 2017-02-03 DIAGNOSIS — G8929 Other chronic pain: Secondary | ICD-10-CM

## 2017-02-03 DIAGNOSIS — M25512 Pain in left shoulder: Secondary | ICD-10-CM | POA: Diagnosis not present

## 2017-02-03 DIAGNOSIS — M25511 Pain in right shoulder: Secondary | ICD-10-CM | POA: Diagnosis not present

## 2017-02-03 NOTE — Progress Notes (Signed)
Subjective:    CC: Shoulder and hand pain  HPI: Shoulder pain: Known subacromial bursitis, previous injection was 5 months ago, having recurrence of pain in his left shoulder, over the deltoid, worse with overhead activities, desires interventional treatment today.  Right hand osteoarthritis: Previous injection was 5 months ago, needs a repeat in the right third and fourth metacarpal phalangeal joints.  Past medical history:  Negative.  See flowsheet/record as well for more information.  Surgical history: Negative.  See flowsheet/record as well for more information.  Family history: Negative.  See flowsheet/record as well for more information.  Social history: Negative.  See flowsheet/record as well for more information.  Allergies, and medications have been entered into the medical record, reviewed, and no changes needed.   Review of Systems: No fevers, chills, night sweats, weight loss, chest pain, or shortness of breath.   Objective:    General: Well Developed, well nourished, and in no acute distress.  Neuro: Alert and oriented x3, extra-ocular muscles intact, sensation grossly intact.  HEENT: Normocephalic, atraumatic, pupils equal round reactive to light, neck supple, no masses, no lymphadenopathy, thyroid nonpalpable.  Skin: Warm and dry, no rashes. Cardiac: Regular rate and rhythm, no murmurs rubs or gallops, no lower extremity edema.  Respiratory: Clear to auscultation bilaterally. Not using accessory muscles, speaking in full sentences.  Procedure: Real-time Ultrasound Guided Injection of left subacromial bursa Device: GE Logiq E  Verbal informed consent obtained.  Time-out conducted.  Noted no overlying erythema, induration, or other signs of local infection.  Skin prepped in a sterile fashion.  Local anesthesia: Topical Ethyl chloride.  With sterile technique and under real time ultrasound guidance:  Advanced a 25-gauge needle into the bursa taking care to avoid  intratendinous injection I placed 1 mL Kenalog 40, 1 mL lidocaine, 1 mL bupivacaine into the bursa. Completed without difficulty  Pain immediately resolved suggesting accurate placement of the medication.  Advised to call if fevers/chills, erythema, induration, drainage, or persistent bleeding.  Images permanently stored and available for review in the ultrasound unit.  Impression: Technically successful ultrasound guided injection.  Procedure: Real-time Ultrasound Guided Injection of right third metacarpophalangeal joint Device: GE Logiq E  Verbal informed consent obtained.  Time-out conducted.  Noted no overlying erythema, induration, or other signs of local infection.  Skin prepped in a sterile fashion.  Local anesthesia: Topical Ethyl chloride.  With sterile technique and under real time ultrasound guidance:  1/2 mL Kenalog 40, 1/2 mL lidocaine injected easily Completed without difficulty  Pain immediately resolved suggesting accurate placement of the medication.  Advised to call if fevers/chills, erythema, induration, drainage, or persistent bleeding.  Images permanently stored and available for review in the ultrasound unit.  Impression: Technically successful ultrasound guided injection.  Procedure: Real-time Ultrasound Guided Injection of right fourth metacarpophalangeal joint Device: GE Logiq E  Verbal informed consent obtained.  Time-out conducted.  Noted no overlying erythema, induration, or other signs of local infection.  Skin prepped in a sterile fashion.  Local anesthesia: Topical Ethyl chloride.  With sterile technique and under real time ultrasound guidance:  1/2 mL Kenalog 40, 1/2 mL lidocaine injected easily Completed without difficulty  Pain immediately resolved suggesting accurate placement of the medication.  Advised to call if fevers/chills, erythema, induration, drainage, or persistent bleeding.  Images permanently stored and available for review in the  ultrasound unit.  Impression: Technically successful ultrasound guided injection.  Impression and Recommendations:    Bilateral shoulder pain Left subacromial injection as above. Previous  injection was 5 months ago.  Hand arthritis Right third and fourth metacarpal phalangeal joint injections as above, per patient request referral to hand surgery for consideration of a surgical solution.

## 2017-02-03 NOTE — Assessment & Plan Note (Signed)
Left subacromial injection as above. Previous injection was 5 months ago.

## 2017-02-03 NOTE — Assessment & Plan Note (Signed)
Right third and fourth metacarpal phalangeal joint injections as above, per patient request referral to hand surgery for consideration of a surgical solution.

## 2017-02-11 ENCOUNTER — Ambulatory Visit: Admitting: Internal Medicine

## 2017-04-24 IMAGING — CR DG CHEST 2V
2 series · 2 of 2 positions shown · non-contrast
Comparison: None.

CLINICAL DATA: Unintentional weight loss

EXAM:
CHEST  2 VIEW

[view not recorded (1 of 2)]
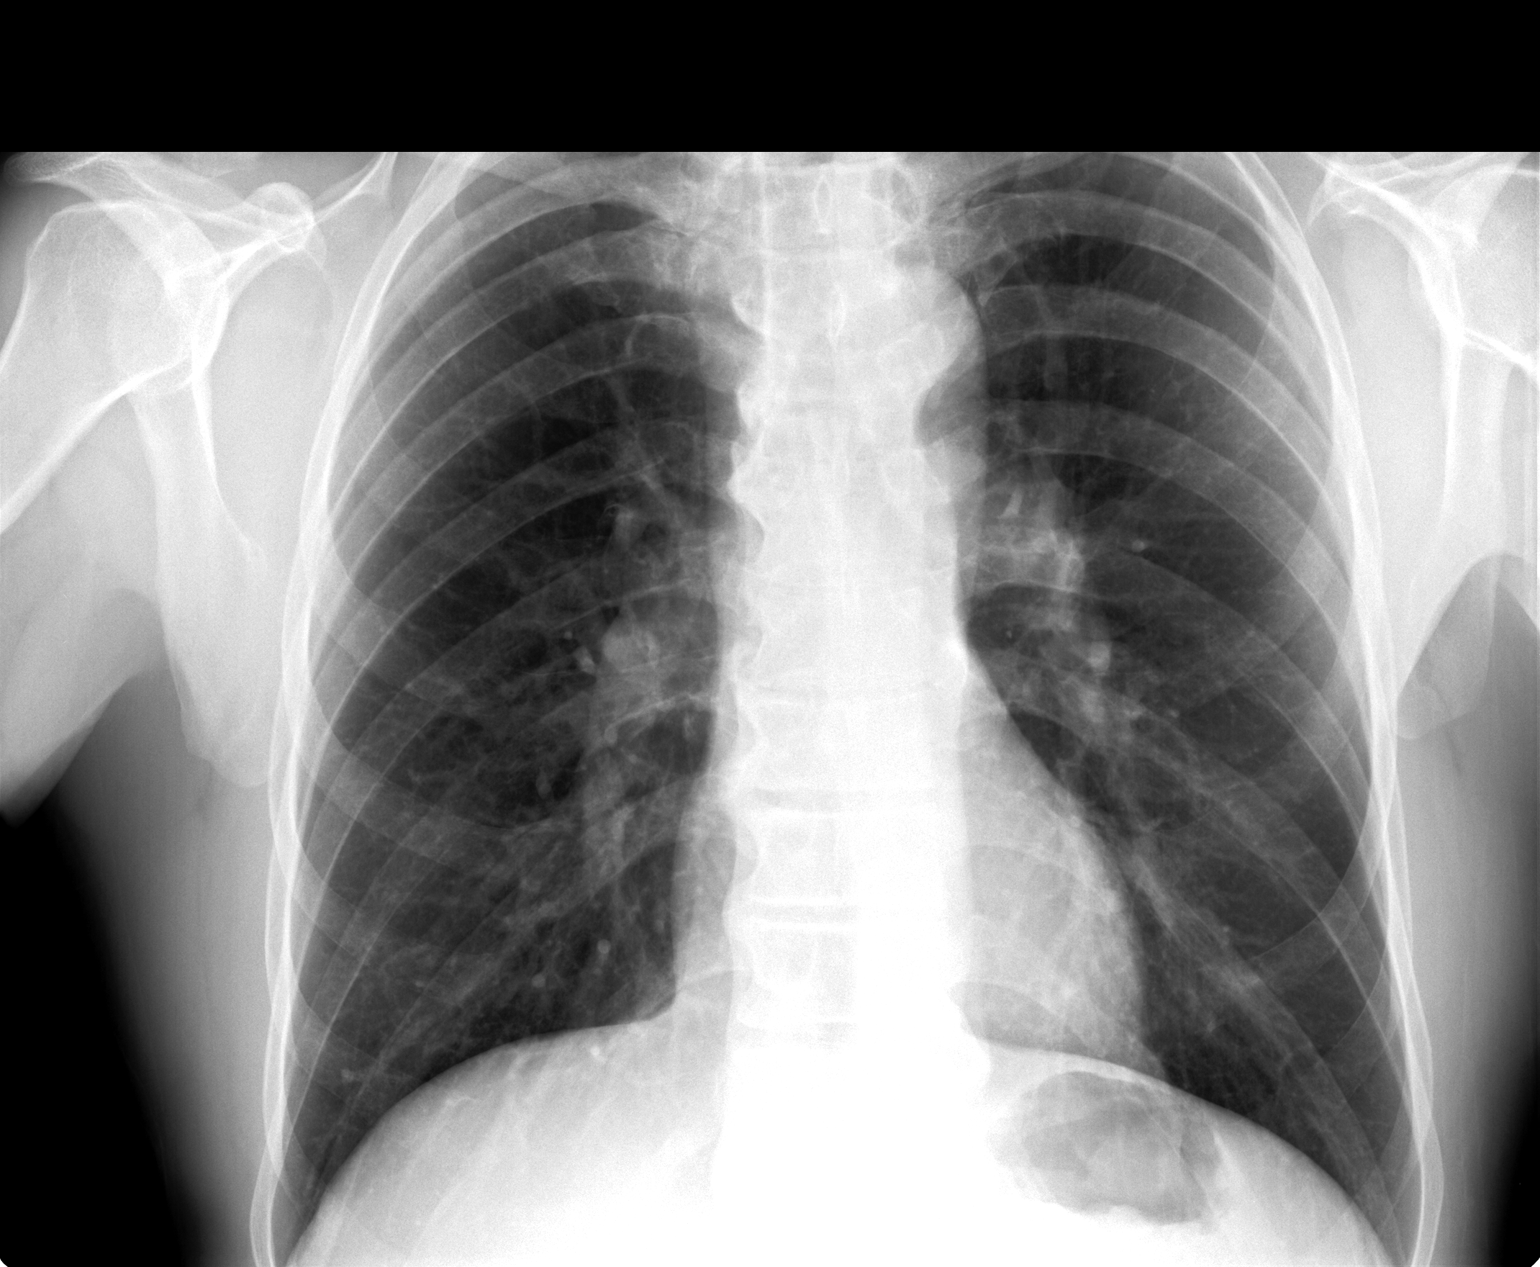

[view not recorded (2 of 2)]
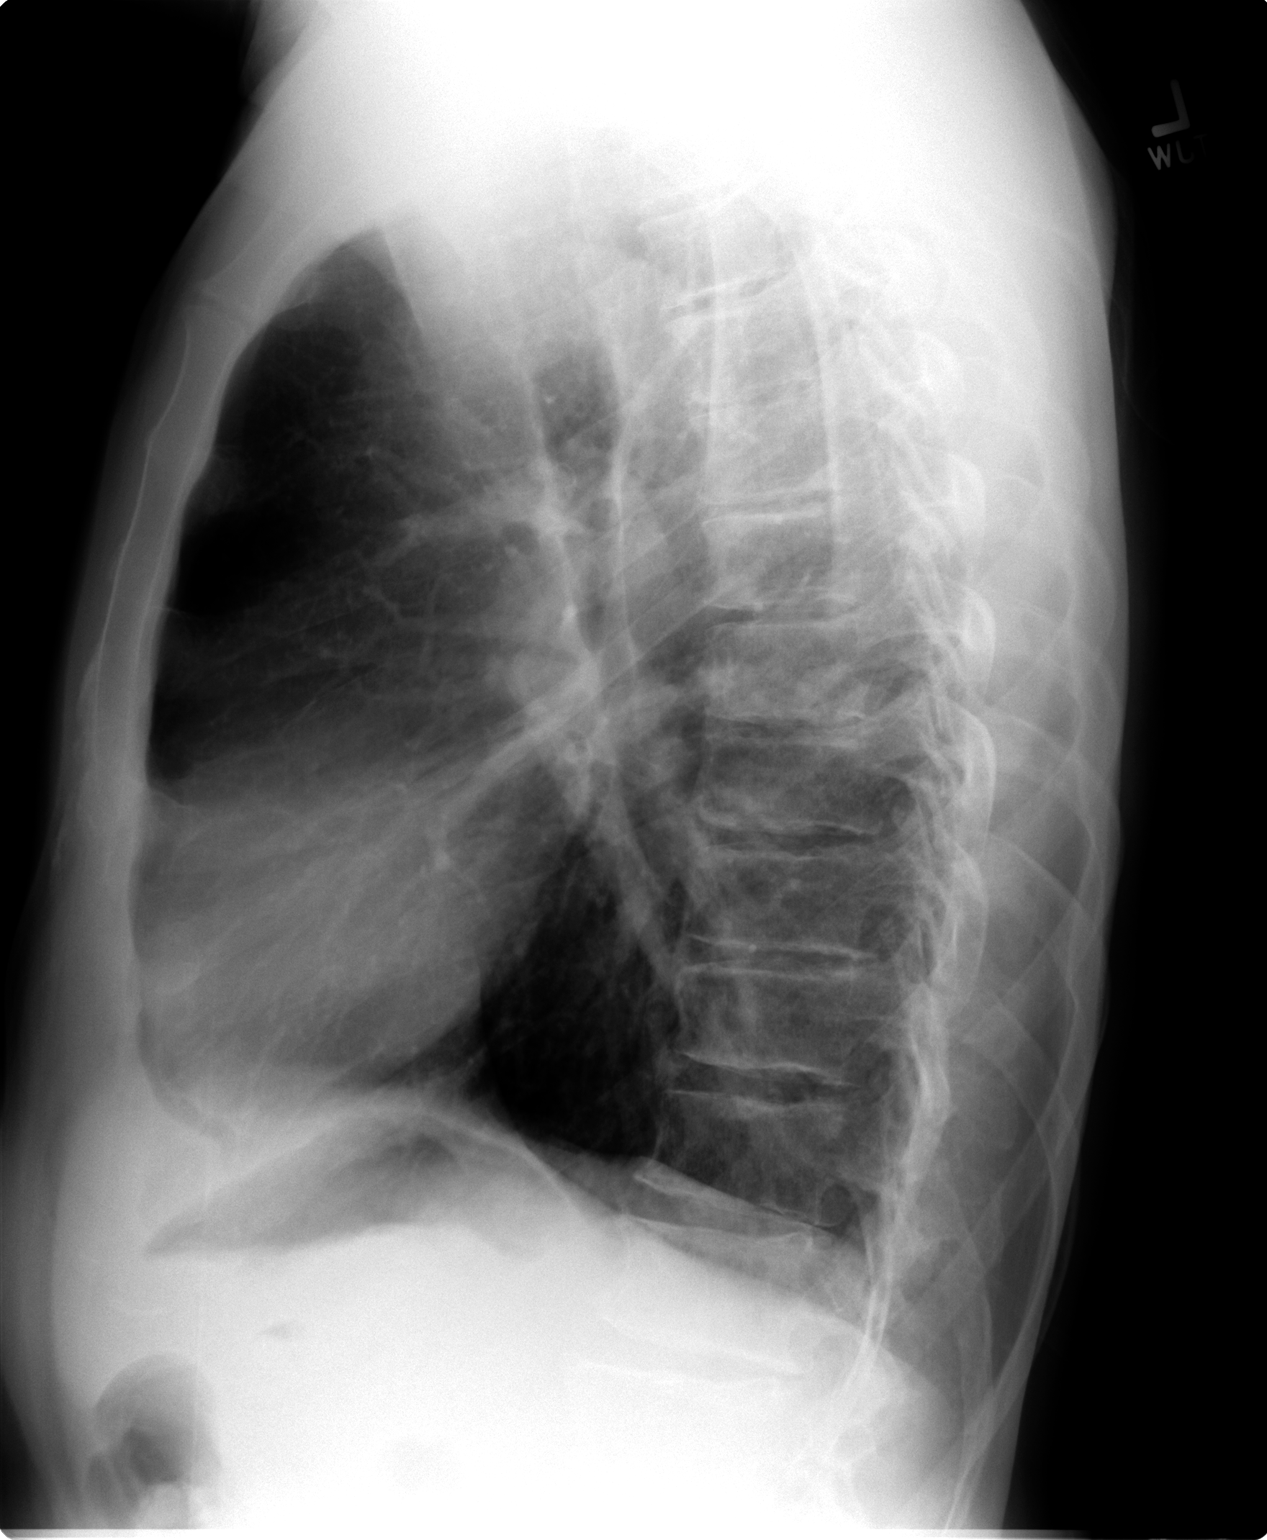

[2 of 2 positions shown; findings below may reference images not displayed]

FINDINGS: The lungs are clear but somewhat hyperaerated. On the frontal view
there is a questionable nodular opacity in the region of the left
hilum just above the left main pulmonary artery. This could be due
to superimposed bony and vascular structures, but a subtle lung
lesion is difficult to exclude. CT of the chest is recommended to
assess further. No mediastinal or hilar adenopathy is noted. The
heart is within normal limits in size. No bony abnormality is seen.
IMPRESSION: 1. Cannot exclude a nodular lesion in the left suprahilar region on
the frontal view. Consider CT of the chest with IV contrast to
assess further.
2. No active infiltrate or effusion.  Slight hyper aeration.

## 2017-06-17 IMAGING — MR MR CERVICAL SPINE W/O CM
5 series · 32 of 48 positions shown · non-contrast
Comparison: Cervical spine plain films 08/08/2015.

CLINICAL DATA: Neck and BILATERAL shoulder pain. LEFT arm weakness.
Symptoms worse for the past 6 months.

EXAM:
MRI CERVICAL SPINE WITHOUT CONTRAST
TECHNIQUE: Multiplanar, multisequence MR imaging of the cervical spine was
performed. No intravenous contrast was administered.

[Series 2: T2 · sagittal · 3.0mm · 0.56mm/px · 6 of 15 slices shown (1 of 2)]
[im 1/15]
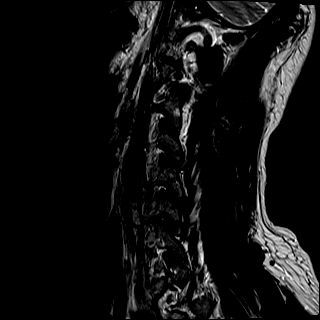
[im 3/15]
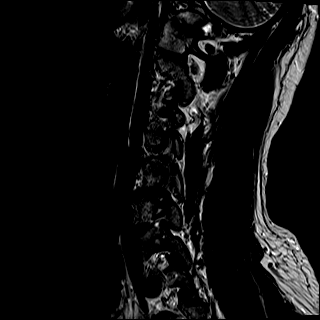
[im 6/15]
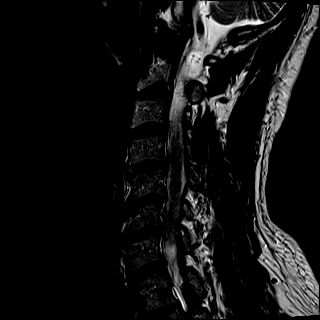
[im 9/15]
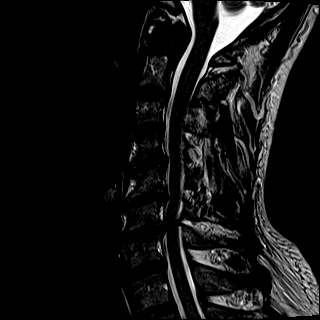
[im 12/15]
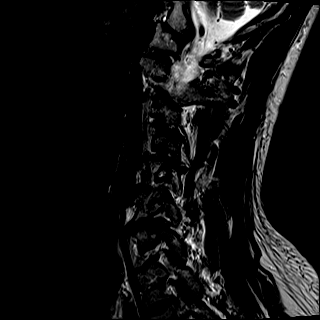
[im 15/15]
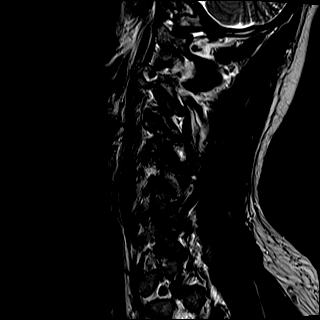

[Series 3: T1 · sagittal · 3.0mm · 0.70mm/px · 7 of 15 slices shown]
[im 1/15]
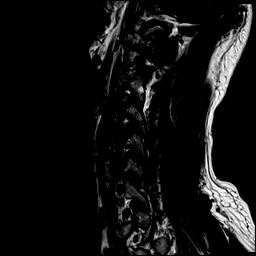
[im 3/15]
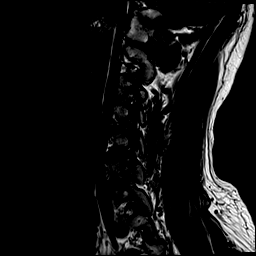
[im 5/15]
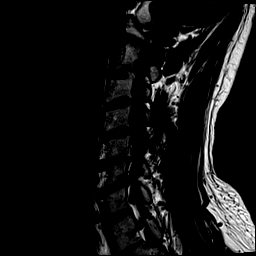
[im 8/15]
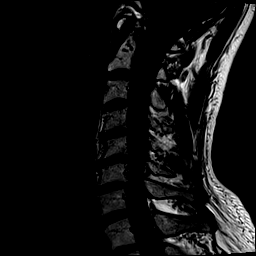
[im 10/15]
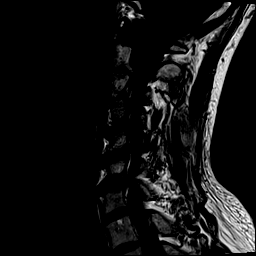
[im 12/15]
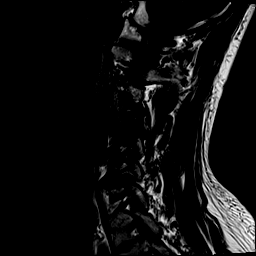
[im 15/15]
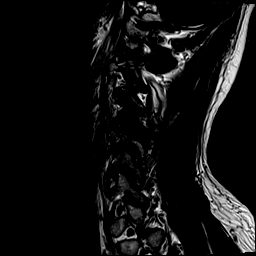

[Series 4: STIR · sagittal · 3.0mm · 0.35mm/px · 7 of 15 slices shown]
[im 1/15]
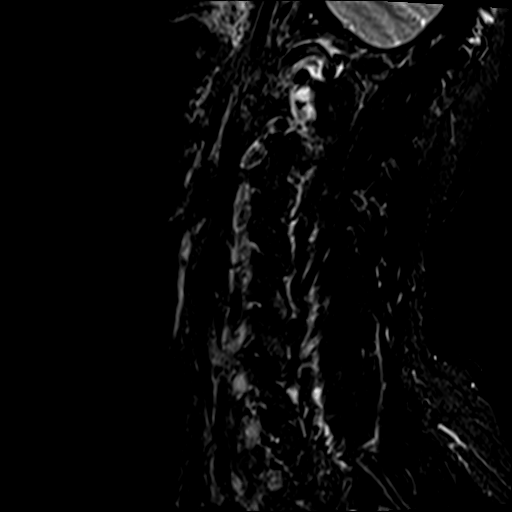
[im 3/15]
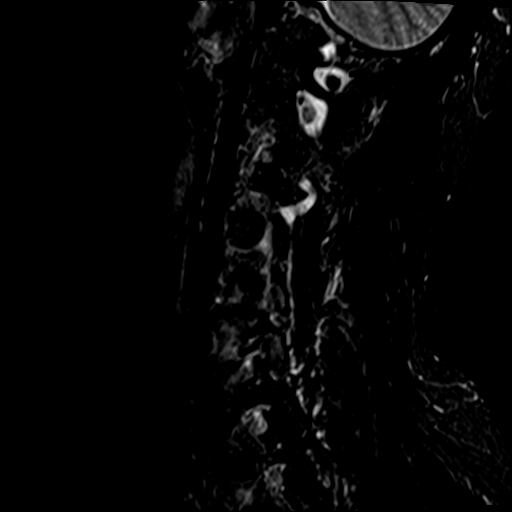
[im 5/15]
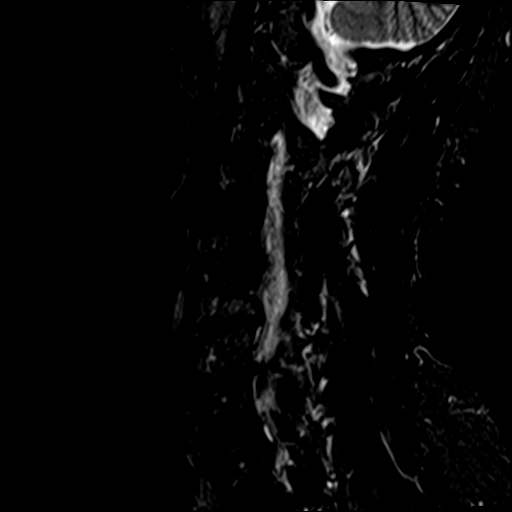
[im 8/15]
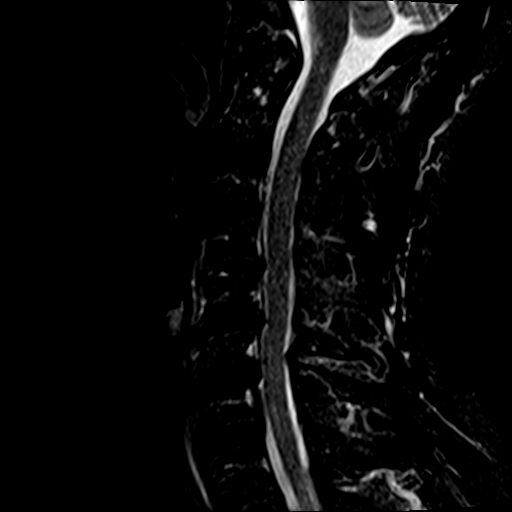
[im 10/15]
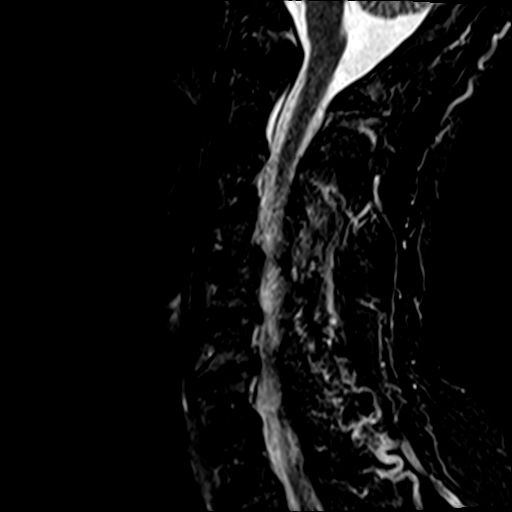
[im 12/15]
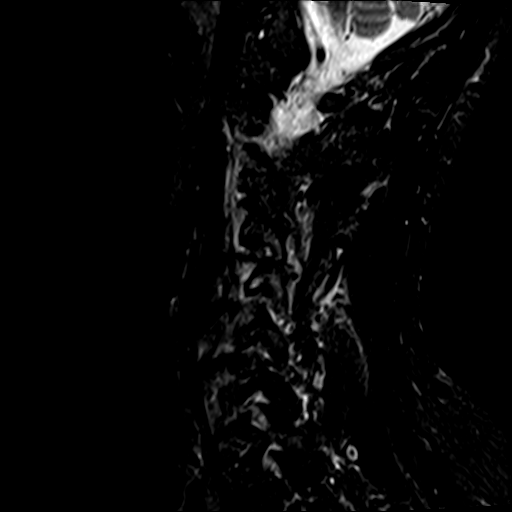
[im 15/15]
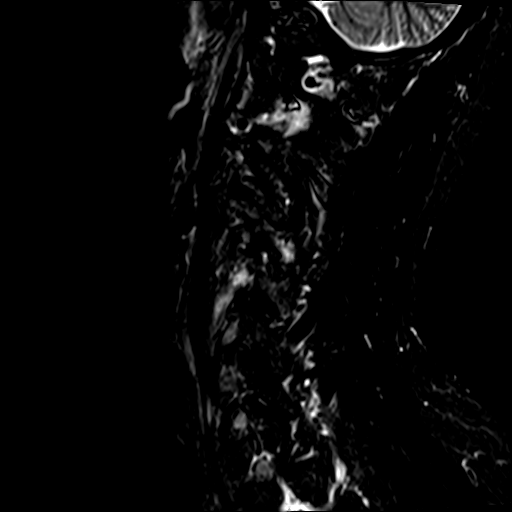

[Series 5: T2 · axial · 3.0mm · 0.62mm/px · z∈[-62,+51]mm · 8 of 31 slices shown (2 of 2)]
[im 1/31]
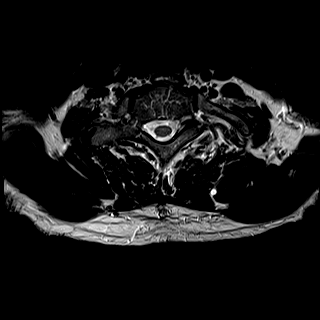
[im 5/31]
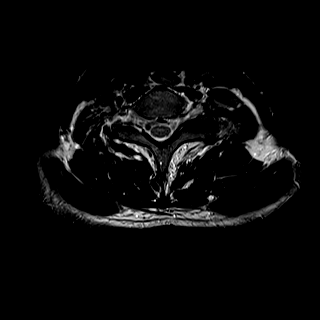
[im 10/31]
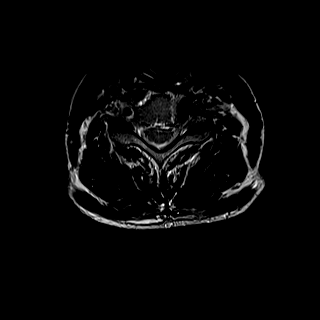
[im 14/31]
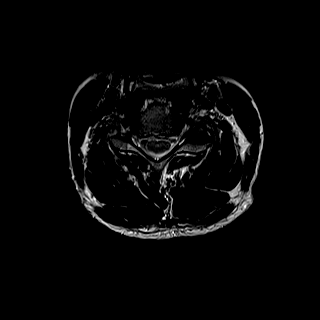
[im 17/31]
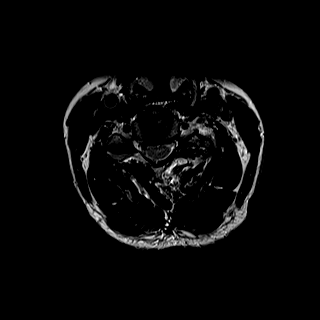
[im 21/31]
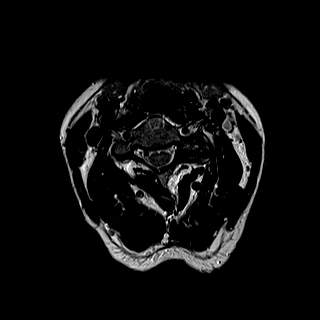
[im 26/31]
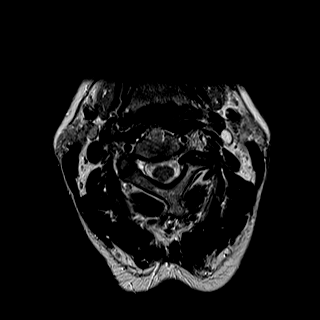
[im 31/31]
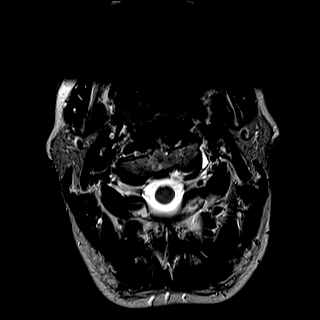

[Series 6: mpgr ax · axial · 3.0mm · 0.39mm/px · z∈[-56,-7]mm · 4 of 31 slices shown]
[im 1/31]
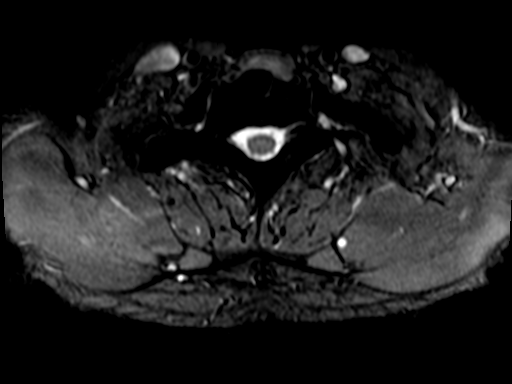
[im 5/31]
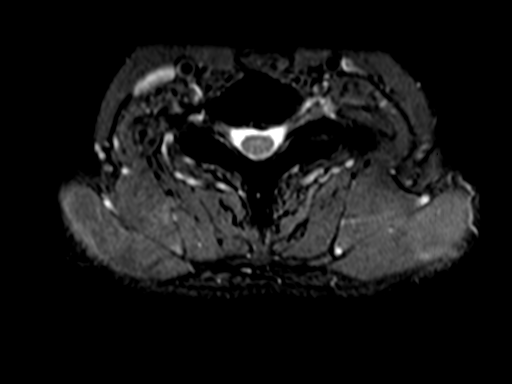
[im 10/31]
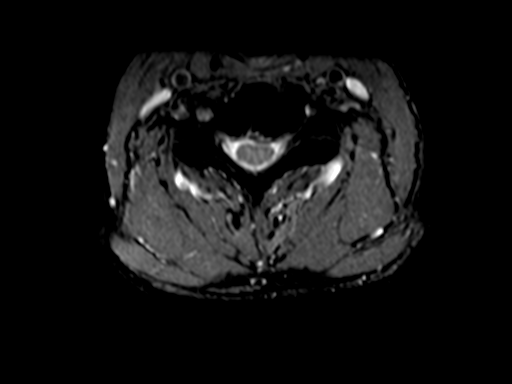
[im 14/31]
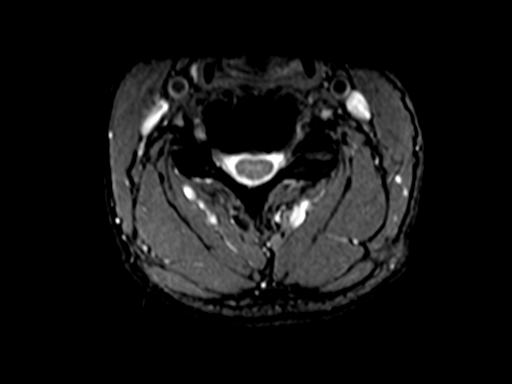

[32 of 48 positions shown; findings below may reference images not displayed]

FINDINGS: Alignment: Slight reversal of the normal cervical lordotic curve.
Trace retrolisthesis C6 on C7.

Vertebrae: No worrisome osseous lesions.

Cord: Mild stenosis with minimal cord flattening at C4-5 and C5-6.
Moderate cord flattening at C6-7 on the LEFT. No abnormal cord
signal.

Posterior Fossa: No tonsillar herniation.

Vertebral Arteries: BILATERAL patent, RIGHT dominant.

Paraspinal tissues: No masses.

Disc levels:

The individual disc spaces were examined as follows:

C2-3: Mild bulge. Severe facet arthropathy on the LEFT. Left-sided
uncinate spurring. LEFT C3 nerve root impingement is possible.

C3-4: Mild bulge. Severe left-sided facet arthropathy and mild
left-sided uncinate hypertrophy. LEFT C4 nerve root impingement is
possible.

C4-5: Broad-based disc protrusion central and to the LEFT associated
with left-sided uncinate hypertrophy. Severe left-sided facet
arthropathy with joint effusion. LEFT C5 nerve root impingement.
Mild canal stenosis with minimal cord flattening. Canal diameter
adequate at 8 mm.

C5-6: Disc space narrowing. Central annular bulging and osteophytic
spurring extends more to the LEFT where there is left-sided uncinate
spurring. Mild BILATERAL facet arthropathy. LEFT greater than RIGHT
foraminal narrowing could affect either C6 nerve root.

C6-7: Advanced disc space narrowing. Osteophytic spurring projects
more to the LEFT. LEFT-sided uncinate spurring with mild annular
bulging. Moderate left-sided cord flattening. Canal diameter 6 mm.
LEFT C7 nerve root impingement is also noted. Mild BILATERAL facet
arthropathy.

C7-T1:  Unremarkable.
IMPRESSION: Multilevel spondylosis as described. The patient's LEFT arm weakness
could derive from neural compression at the C6-7 level, where
osteophytic and uncinate spurring severely narrow the foramen and
result in moderate left-sided cord flattening.

Less severe, but potentially compressive, disc and bony pathology
narrow the neural foramina at C2-3, C3-4, C4-5, and C5-6.

Severe multilevel facet arthropathy, worst at LEFT C4-C5.

## 2017-07-05 NOTE — Telephone Encounter (Signed)
Order referral

## 2017-10-18 ENCOUNTER — Ambulatory Visit (INDEPENDENT_AMBULATORY_CARE_PROVIDER_SITE_OTHER): Admitting: Sports Medicine

## 2017-10-18 ENCOUNTER — Encounter: Payer: Self-pay | Admitting: Sports Medicine

## 2017-10-18 DIAGNOSIS — M19049 Primary osteoarthritis, unspecified hand: Secondary | ICD-10-CM | POA: Diagnosis not present

## 2017-10-18 NOTE — Progress Notes (Signed)
Subjective:    CC: Hand pain  HPI: Jonathon Hill is a pleasant 62 year old male, he has known right hand osteoarthritis, in the past we have given him injections into the right third and fourth MCPs.  Previous injections were 8 months ago.  Now having a recurrence of pain, moderate, persistent, localized at the 2 above joints without radiation.  NSAIDs are not effective.  I reviewed the past medical history, family history, social history, surgical history, and allergies today and no changes were needed.  Please see the problem list section below in epic for further details.  Past Medical History: Past Medical History:  Diagnosis Date  . Anxiety   . Arthritis   . Cataract   . Enlarged prostate   . History of cellulitis 1998   right knee  . History of MRSA infection 01/03/16  . Hyperlipidemia    Past Surgical History: Past Surgical History:  Procedure Laterality Date  . EYE SURGERY  2001   LT, cataract, detached retina 2001,  buckle of sclera  [facility]  . EYE SURGERY  2004   RT, Cataract  . EYE SURGERY  2004    Lasik, LT  . [place], 2004   2 prior lumbar surgeries ([facility])   Social History: Social History   Socioeconomic History  . Marital status: Married    Spouse name: None  . Number of children: None  . Years of education: None  . Highest education level: None  Social Needs  . Financial resource strain: None  . Food insecurity - worry: None  . Food insecurity - inability: None  . Transportation needs - medical: None  . Transportation needs - non-medical: None  Occupational History  . None  Tobacco Use  . Smoking status: Never Smoker  . Smokeless tobacco: Never Used  Substance and Sexual Activity  . Alcohol use: Yes    Comment: rare  . Drug use: No  . Sexual activity: Yes  Other Topics Concern  . None  Social History Narrative  . None   Family History: Family History  Problem Relation Age of Onset  . Cancer Mother   . Heart disease Father     . Depression Sister   . Cancer Sister   . Colon cancer Neg Hx   . Esophageal cancer Neg Hx   . Rectal cancer Neg Hx   . Stomach cancer Neg Hx    Allergies: No Known Allergies Medications: See med rec.  Review of Systems: No fevers, chills, night sweats, weight loss, chest pain, or shortness of breath.   Objective:    General: Well Developed, well nourished, and in no acute distress.  Neuro: Alert and oriented x3, extra-ocular muscles intact, sensation grossly intact.  HEENT: Normocephalic, atraumatic, pupils equal round reactive to light, neck supple, no masses, no lymphadenopathy, thyroid nonpalpable.  Skin: Warm and dry, no rashes. Cardiac: Regular rate and rhythm, no murmurs rubs or gallops, no lower extremity edema.  Respiratory: Clear to auscultation bilaterally. Not using accessory muscles, speaking in full sentences. Right hand: Visibly swollen third and fourth MCPs.  Good motion, good strength.  Procedure: Real-time Ultrasound Guided Injection of right third MCP Device: GE Logiq E  Verbal informed consent obtained.  Time-out conducted.  Noted no overlying erythema, induration, or other signs of local infection.  Skin prepped in a sterile fashion.  Local anesthesia: Topical Ethyl chloride.  With sterile technique and under real time ultrasound guidance: 1/2 cc kenalog 40, 1/2 cc lidocaine injected easily  Completed without difficulty  Pain immediately resolved suggesting accurate placement of the medication.  Advised to call if fevers/chills, erythema, induration, drainage, or persistent bleeding.  Images permanently stored and available for review in the ultrasound unit.  Impression: Technically successful ultrasound guided injection.  Procedure: Real-time Ultrasound Guided Injection of right fourth MCP Device: GE Logiq E  Verbal informed consent obtained.  Time-out conducted.  Noted no overlying erythema, induration, or other signs of local infection.  Skin prepped  in a sterile fashion.  Local anesthesia: Topical Ethyl chloride.  With sterile technique and under real time ultrasound guidance: 1/2 cc kenalog 40, 1/2 cc lidocaine injected easily Completed without difficulty  Pain immediately resolved suggesting accurate placement of the medication.  Advised to call if fevers/chills, erythema, induration, drainage, or persistent bleeding.  Images permanently stored and available for review in the ultrasound unit.  Impression: Technically successful ultrasound guided injection.  Impression and Recommendations:    Hand arthritis Repeat right third and fourth MCP injections, previous injections were 8 months ago. Return as needed. ___________________________________________ Gwen Her. Dianah Field, M.D., ABFM., CAQSM. Primary Care and [place] Instructor of [place] [facility]

## 2017-10-18 NOTE — Assessment & Plan Note (Signed)
Repeat right third and fourth MCP injections, previous injections were 8 months ago. Return as needed.
